# Patient Record
Sex: Female | Born: 1975 | Race: Asian | Hispanic: No | Marital: Married | Smoking: Never smoker
Health system: Southern US, Community
[De-identification: ages and names within clinical notes are randomized; demographics above are authoritative.]

## PROBLEM LIST (undated history)

## (undated) DIAGNOSIS — IMO0002 Reserved for concepts with insufficient information to code with codable children: Secondary | ICD-10-CM

## (undated) DIAGNOSIS — K8020 Calculus of gallbladder without cholecystitis without obstruction: Secondary | ICD-10-CM

## (undated) DIAGNOSIS — K219 Gastro-esophageal reflux disease without esophagitis: Secondary | ICD-10-CM

## (undated) HISTORY — DX: Reserved for concepts with insufficient information to code with codable children: IMO0002

## (undated) HISTORY — DX: Gastro-esophageal reflux disease without esophagitis: K21.9

---

## 2012-09-17 ENCOUNTER — Ambulatory Visit (INDEPENDENT_AMBULATORY_CARE_PROVIDER_SITE_OTHER): Payer: BC Managed Care – PPO | Admitting: Family Medicine

## 2012-09-17 VITALS — BP 108/70 | HR 72 | Temp 98.0°F | Resp 16 | Ht 60.0 in | Wt 138.6 lb

## 2012-09-17 DIAGNOSIS — Z23 Encounter for immunization: Secondary | ICD-10-CM

## 2012-09-17 DIAGNOSIS — Z2839 Other underimmunization status: Secondary | ICD-10-CM

## 2012-09-17 DIAGNOSIS — Z283 Underimmunization status: Secondary | ICD-10-CM

## 2012-09-17 DIAGNOSIS — Z0000 Encounter for general adult medical examination without abnormal findings: Secondary | ICD-10-CM

## 2012-09-17 LAB — POCT UA - MICROSCOPIC ONLY
Casts, Ur, LPF, POC: NEGATIVE
Crystals, Ur, HPF, POC: NEGATIVE
Mucus, UA: NEGATIVE
RBC, urine, microscopic: NEGATIVE
WBC, Ur, HPF, POC: NEGATIVE
Yeast, UA: NEGATIVE

## 2012-09-17 LAB — POCT WET PREP WITH KOH
Clue Cells Wet Prep HPF POC: NEGATIVE
KOH Prep POC: NEGATIVE
RBC Wet Prep HPF POC: NEGATIVE
Trichomonas, UA: NEGATIVE
Yeast Wet Prep HPF POC: NEGATIVE

## 2012-09-17 LAB — POCT URINALYSIS DIPSTICK
Bilirubin, UA: NEGATIVE
Blood, UA: NEGATIVE
Glucose, UA: NEGATIVE
Ketones, UA: NEGATIVE
Leukocytes, UA: NEGATIVE
Nitrite, UA: NEGATIVE
Protein, UA: NEGATIVE
Spec Grav, UA: 1.015
Urobilinogen, UA: 0.2
pH, UA: 7.5

## 2012-09-17 NOTE — Progress Notes (Signed)
[facility]:  Office Visit  Chief Complaint:  Chief Complaint  Patient presents with  . CPE w/PAP    Pt is not fasting - pt states she has had cake and coffeew/cream and sugar  . Immunizations    May need Hep B #3; Tdap  . URI    HA; neck pain x 1 dy    HPI: Pamela Stanley is a 36 y.o. female who complains of  CPE. Has never had a pap. She recently brings in her immunization records from [place] which she got prior to coming to the [place] about 3 months ago. The vaccine records are sparse. Upon review all I can see that she is missing is the 2nd dose of the Varicella and also her 3rd dose of Hep B. Patient also c/o URI sxs.   Past Medical History  Diagnosis Date  . Ulcer    History reviewed. No pertinent past surgical history. History   Social History  . Marital Status: Married    Spouse Name: N/A    Number of Children: N/A  . Years of Education: N/A   Social History Main Topics  . Smoking status: Never Smoker   . Smokeless tobacco: None  . Alcohol Use: No  . Drug Use: No  . Sexually Active: Yes   Other Topics Concern  . None   Social History Narrative  . None   Family History  Problem Relation Age of Onset  . Hypertension Father    No Known Allergies Prior to Admission medications   Not on File     ROS: The patient denies fevers, chills, night sweats, unintentional weight loss, chest pain, palpitations, wheezing, dyspnea on exertion, nausea, vomiting, abdominal pain, dysuria, hematuria, melena, numbness, weakness, or tingling.  All other systems have been reviewed and were otherwise negative with the exception of those mentioned in the HPI and as above.    PHYSICAL EXAM: Filed Vitals:   09/17/12 1122  BP: 108/70  Pulse: 72  Temp: 98 F (36.7 C)  Resp: 16   Filed Vitals:   09/17/12 1122  Height: 5' (1.524 m)  Weight: 138 lb 9.6 oz (62.869 kg)   Body mass index is 27.07 kg/(m^2).  General: Alert, no acute distress HEENT:   Normocephalic, atraumatic, oropharynx patent. EOMI, PERRLA, fundoscopic exam nl.  Cardiovascular:  Regular rate and rhythm, no rubs murmurs or gallops.  No Carotid bruits, radial pulse intact. No pedal edema.  Respiratory: Clear to auscultation bilaterally.  No wheezes, rales, or rhonchi.  No cyanosis, no use of accessory musculature GI: No organomegaly, abdomen is soft and non-tender, positive bowel sounds.  No masses. Skin: No rashes. Neurologic: Facial musculature symmetric. Psychiatric: Patient is appropriate throughout our interaction. Lymphatic: No cervical lymphadenopathy Musculoskeletal: Gait intact. Neck ROM nl. No meningeal signs Breast exam nl GU-no CMT, cervix is normal appearing except for nabothia cyst at 2 pm. She has white dc but no purulent dc.    LABS: Results for orders placed in visit on 09/17/12  POCT UA - MICROSCOPIC ONLY      Component Value Range   WBC, Ur, HPF, POC neg     RBC, urine, microscopic neg     Bacteria, U Microscopic trace     Mucus, UA neg     Epithelial cells, urine per micros 1-2     Crystals, Ur, HPF, POC neg     Casts, Ur, LPF, POC neg     Yeast, UA neg  POCT URINALYSIS DIPSTICK      Component Value Range   Color, UA yellow     Clarity, UA slightly cloudy     Glucose, UA neg     Bilirubin, UA neg     Ketones, UA neg     Spec Grav, UA 1.015     Blood, UA neg     pH, UA 7.5     Protein, UA neg     Urobilinogen, UA 0.2     Nitrite, UA neg     Leukocytes, UA Negative    POCT WET PREP WITH KOH      Component Value Range   Trichomonas, UA Negative     Clue Cells Wet Prep HPF POC neg     Epithelial Wet Prep HPF POC 5-10     Yeast Wet Prep HPF POC neg     Bacteria Wet Prep HPF POC 2+     RBC Wet Prep HPF POC neg     WBC Wet Prep HPF POC 1-3     KOH Prep POC Negative       EKG/XRAY:   Primary read interpreted by Dr. Conley Rolls at [facility].   ASSESSMENT/PLAN: Encounter Diagnoses  Name Primary?  . Annual physical exam Yes  . Immunization  deficiency    WIll return in AM for labs since not fasting [facility] receive Hep B 3rd dose and also Flu vaccine     Vivien Barretto PHUONG, DO 09/17/2012 1:07 PM

## 2012-09-18 ENCOUNTER — Other Ambulatory Visit (INDEPENDENT_AMBULATORY_CARE_PROVIDER_SITE_OTHER): Payer: BC Managed Care – PPO

## 2012-09-18 DIAGNOSIS — Z0000 Encounter for general adult medical examination without abnormal findings: Secondary | ICD-10-CM

## 2012-09-18 DIAGNOSIS — Z2839 Other underimmunization status: Secondary | ICD-10-CM

## 2012-09-18 DIAGNOSIS — Z283 Underimmunization status: Secondary | ICD-10-CM

## 2012-09-18 LAB — COMPREHENSIVE METABOLIC PANEL
ALT: 21 U/L (ref 0–35)
AST: 21 U/L (ref 0–37)
Albumin: 3.9 g/dL (ref 3.5–5.2)
Alkaline Phosphatase: 39 U/L (ref 39–117)
BUN: 12 mg/dL (ref 6–23)
Creat: 0.65 mg/dL (ref 0.50–1.10)
Potassium: 4.6 mEq/L (ref 3.5–5.3)

## 2012-09-18 LAB — COMPREHENSIVE METABOLIC PANEL WITH GFR
CO2: 27 meq/L (ref 19–32)
Calcium: 9.3 mg/dL (ref 8.4–10.5)
Chloride: 107 meq/L (ref 96–112)
Glucose, Bld: 90 mg/dL (ref 70–99)
Sodium: 140 meq/L (ref 135–145)
Total Bilirubin: 0.4 mg/dL (ref 0.3–1.2)
Total Protein: 6.5 g/dL (ref 6.0–8.3)

## 2012-09-18 NOTE — Progress Notes (Signed)
Patient here for labs only. 

## 2012-09-21 LAB — PAP IG, CT-NG, RFX HPV ASCU
Chlamydia Probe Amp: NEGATIVE
GC Probe Amp: NEGATIVE

## 2012-10-06 ENCOUNTER — Encounter: Payer: Self-pay | Admitting: Family Medicine

## 2012-10-11 ENCOUNTER — Encounter: Payer: Self-pay | Admitting: Family Medicine

## 2012-12-27 ENCOUNTER — Ambulatory Visit (INDEPENDENT_AMBULATORY_CARE_PROVIDER_SITE_OTHER): Payer: BC Managed Care – PPO | Admitting: Emergency Medicine

## 2012-12-27 VITALS — BP 110/74 | HR 73 | Temp 98.1°F | Resp 16 | Ht 60.3 in | Wt 137.0 lb

## 2012-12-27 DIAGNOSIS — G5680 Other specified mononeuropathies of unspecified upper limb: Secondary | ICD-10-CM

## 2012-12-27 DIAGNOSIS — S335XXA Sprain of ligaments of lumbar spine, initial encounter: Secondary | ICD-10-CM

## 2012-12-27 MED ORDER — TRAMADOL-ACETAMINOPHEN 37.5-325 MG PO TABS: 1.0000 | ORAL_TABLET | Freq: Four times a day (QID) | ORAL | Status: DC | PRN

## 2012-12-27 MED ORDER — TRIAMCINOLONE ACETONIDE 0.1 % EX CREA: TOPICAL_CREAM | Freq: Two times a day (BID) | CUTANEOUS | Status: DC

## 2012-12-27 MED ORDER — CYCLOBENZAPRINE HCL 10 MG PO TABS: 10.0000 mg | ORAL_TABLET | Freq: Three times a day (TID) | ORAL | Status: DC | PRN

## 2012-12-27 MED ORDER — NAPROXEN SODIUM 550 MG PO TABS: 550.0000 mg | ORAL_TABLET | Freq: Two times a day (BID) | ORAL | Status: DC

## 2012-12-27 NOTE — Patient Instructions (Addendum)
Pamela Stanley  (Lumbrosacral Strain)  Pamela Stanley là m?t trong nh?ng nguyên nhân ph? bi?n nh?t c?a ?au Stanley. Có nhi?u nguyên nhân có th? gây ra ?au Stanley. H?u h?t không ph?i là tình tr?ng nghiêm tr?ng.   NGUYÊN NHÂN  X??ng s?ng (c?t s?ng) ???c t?o t? 24 ??t s?ng chính ngoài x??ng cùng và x??ng c?t. Các x??ng này ???c gi? v?i nhau b?ng mô x? Pamela ???c g?i là dây ch?ng và c? b?ng c?. Các r? th?n kinh xuyên qua các l? gi?a các ??t s?ng. S? di chuy?n ??t ng?t ho?c ch?n th??ng Stanley có th? gây t?n th??ng ho?c gây áp l?c lên nh?ng dây th?n kinh này. ?i?u này có th? d?n ??n ?au Stanley c?c b? ho?c d?ch chuy?n (b?c x?) ?au xu?ng mông và xu?ng chân. Tình tr?ng ???c bi?t ??n nh? là ?au th?n kinh t?a (?au nhói t? mông xu?ng ph?n sau chân) th??ng k?t h?p v?i thoát v? ??a ??m. ?au có th? ch? gây ra b?i s? co th?t c?.  Chuyên gia ch?m sóc y t? có th? th??ng tìm th?y nguyên nhân ?au t? chi ti?t c?a tri?u ch?ng và xét nghi?m. Trong m?t s? tr??ng h?p, b?n có th? c?n m?t s? ki?m tra (ch?ng h?n nh? ch?p x-quang), nh?ng nh?ng ki?m tra này có th? không phù h?p v?i b?n. Chuyên gia ch?m sóc y t? s? làm vi?c v?i b?n ?? quy?t ??nh xem b?n có c?n b?t c? ki?m tra nào không d?a vào xét nghi?m c? th? c?a b?n.  H??NG D?N CH?M SÓC T?I NHÀ  · Tránh phong cách s?ng ít ho?t ??ng. T?p luy?n ch? ??ng, theo h??ng d?n c?a chuyên gia ch?m sóc y t?, là v? khí t?t nh?t ch?ng l?i ?au Stanley.  · N?u b?n ?ang không ? trong tình tr?ng th? ch?t thích h?p cho nó, tránh nh?ng ho?t ??ng th? ch?t n?ng (qu?n v?t, l??t ván n??c). ?i?u này có th? làm tr?m tr?ng thêm và/ho?c t?o ra v?n ??.  · N?u b?n có m?t v?n ?? v? Stanley, tránh nh?ng môn th? thao ?òi h?i ph?i chuy?n ??ng c? th? ??t ng?t. B?i và ?i b? th??ng là nh?ng ho?t ??ng an toàn h?n.  · Duy trì t? th? t?t.  · Tránh b? quá cân (béo phì).  · Ch? n?m ngh? trên gi??ng trong giai ?o?n cùng c?c, ??t ng?t nh?t (c?p tính). Chuyên gia ch?m sóc y t? s? giúp b?n xác ??nh xem b?n c?n n?m ngh? trên gi??ng trong bao lâu.  · ??i  v?i nh?ng ?i?u ki?n c?p tính, b?n có th? ch??m ?á lên vùng b? th??ng.  · Cho ?á bào vào túi nh?a.  · ??t kh?n t?m gi?a da và túi.  · Ch??m ?á trong 15 ??n 20 phút m?i l?n, 2 gi? m?t l?n, ho?c khi c?n thi?t.  · Sau khi b?n khá h?n và n?ng ??ng h?n, vi?c ch??m nhi?t trong 30 phút tr??c khi ho?t ??ng có th? giúp ích.  G?p chuyên gia ch?m sóc y t? n?u b?n b? ?au kéo dài h?n mong ??i. Chuyên gia ch?m sóc y t? có th? giúp ho?c t? v?n cho b?n v? nh?ng bài t?p và/ho?c li?u pháp thích h?p, n?u c?n. V?i ?i?u ki?n, h?u h?t v?n ?? v? Stanley có th? tránh ???c.   HÃY NGAY L?P T?C THAM V?N V?I CHUYÊN GIA Y T? N?U:  · B?n b? tê Pamela, ?au nhói dây th?n kinh, y?u ho?c v?n ?? v?i vi?c s?   d?ng tay ho?c chân.  · B?n b? ?au Stanley n?ng không gi?m ???c nh? thu?c.  · Có s? thay ??i trong s? ki?m soát ru?t ho?c bàng quang.  · B?n b? ?au gia t?ng trong b?t k? khu v?c nào c?a c? th?, bao g?m d? dày ho?c b?ng.  · B?n th?y khó th?, hoa m?t ho?c ng?t.  · B?n c?m th?y khó ch?u trong d? dày (bu?n nôn), nôn ho?c vã m? hôi.  · B?n th?y bi?n màu ngón chân ho?c chân ho?c bàn chân tr? nên r?t l?nh.  · ?au Stanley tr? nên t? h?n.  · B?n b? s?t.  ??M B?O B?N:   · Hi?u các h??ng d?n này.  · Theo dõi tình tr?ng c?a mình.  · Yêu c?u tr? giúp ngay l?p t?c n?u b?n c?m th?y không kh?e ho?c tình tr?ng tr? nên t?i t? h?n.  Document Released: 08/28/2005 Document Revised: 02/10/2012  Exit[employer]® Patient Information ©2013 [employer].

## 2012-12-27 NOTE — Progress Notes (Signed)
[facility] [address] [place] [address] [phone]  Date:  12/27/2012   Name:  Pamela Stanley   DOB:  24-May-1976   MRN:  [id number]  PCP:  No primary provider on file.    Chief Complaint: Back Pain   History of Present Illness:  Pamela Stanley is a 37 y.o. very pleasant female patient who presents with the following:  No history of injury or overuse.  Yesterday developed pain in low back on right side and pain in posterior right shoulder.  No radiation or neuro symptoms.  No history of prior back pain.  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  No other complaints  There is no problem list on file for this patient.   Past Medical History  Diagnosis Date  . Ulcer     No past surgical history on file.  History  Substance Use Topics  . Smoking status: Never Smoker   . Smokeless tobacco: Not on file  . Alcohol Use: No    Family History  Problem Relation Age of Onset  . Hypertension Father     No Known Allergies  Medication list has been reviewed and updated.  No current outpatient prescriptions on file prior to visit.    Review of Systems:  As per HPI, otherwise negative.    Physical Examination: Filed Vitals:   12/27/12 1343  BP: 110/74  Pulse: 73  Temp: 98.1 F (36.7 C)  Resp: 16   Filed Vitals:   12/27/12 1343  Height: 5' 0.3" (1.532 m)  Weight: 137 lb (62.143 kg)   Body mass index is 26.49 kg/(m^2). Ideal Body Weight: Weight in (lb) to have BMI = 25: 129    GEN: WDWN, NAD, Non-toxic, Alert & Oriented x 3 HEENT: Atraumatic, Normocephalic.  Ears and Nose: No external deformity. EXTR: No clubbing/cyanosis/edema NEURO: Normal gait.  PSYCH: Normally interactive. Conversant. Not depressed or anxious appearing.  Calm demeanor.  BACK:  Tender right posterior shoulder medial to scapula and in lower para spinous muscle lumbar area.  Normal motor and cerebellar exam  Assessment and  Plan: Lumbar strain Anaprox Flexeril Ultram Follow up as needed Local heat  Carmelina Dane, MD

## 2013-01-03 ENCOUNTER — Ambulatory Visit (INDEPENDENT_AMBULATORY_CARE_PROVIDER_SITE_OTHER): Payer: BC Managed Care – PPO | Admitting: Family Medicine

## 2013-01-03 ENCOUNTER — Ambulatory Visit: Payer: BC Managed Care – PPO

## 2013-01-03 VITALS — BP 110/80 | HR 101 | Temp 98.0°F | Resp 16 | Ht 61.0 in | Wt 137.0 lb

## 2013-01-03 DIAGNOSIS — M542 Cervicalgia: Secondary | ICD-10-CM

## 2013-01-03 DIAGNOSIS — R200 Anesthesia of skin: Secondary | ICD-10-CM

## 2013-01-03 DIAGNOSIS — M79669 Pain in unspecified lower leg: Secondary | ICD-10-CM

## 2013-01-03 DIAGNOSIS — M25539 Pain in unspecified wrist: Secondary | ICD-10-CM

## 2013-01-03 DIAGNOSIS — M79609 Pain in unspecified limb: Secondary | ICD-10-CM

## 2013-01-03 DIAGNOSIS — N938 Other specified abnormal uterine and vaginal bleeding: Secondary | ICD-10-CM

## 2013-01-03 DIAGNOSIS — N926 Irregular menstruation, unspecified: Secondary | ICD-10-CM

## 2013-01-03 DIAGNOSIS — N949 Unspecified condition associated with female genital organs and menstrual cycle: Secondary | ICD-10-CM

## 2013-01-03 DIAGNOSIS — M25531 Pain in right wrist: Secondary | ICD-10-CM

## 2013-01-03 DIAGNOSIS — Z789 Other specified health status: Secondary | ICD-10-CM | POA: Insufficient documentation

## 2013-01-03 DIAGNOSIS — Z603 Acculturation difficulty: Secondary | ICD-10-CM

## 2013-01-03 DIAGNOSIS — R209 Unspecified disturbances of skin sensation: Secondary | ICD-10-CM

## 2013-01-03 MED ORDER — PREDNISONE 20 MG PO TABS: ORAL_TABLET | ORAL | Status: DC

## 2013-01-03 NOTE — Patient Instructions (Addendum)
We are going to use the prednisone for a pinched nerve in your back.  Do not use the anaprox with this medication-  However you may use the flexeril or ultracet  Use the wrist splint for your right wrist  If you develop any worsening numbness, slurred speech or facial drooping you could be having a stroke and should seek care right away  Take a home pregnancy test if you do not get your period in the next 2 days.  If positive please call and let us know

## 2013-01-03 NOTE — Progress Notes (Signed)
[facility] [address] [place] [address] [phone]  Date:  01/03/2013   Name:  Pamela Stanley   DOB:  12/11/1975   MRN:  [id number]  PCP:  No primary provider on file.    Chief Complaint: Numbness and Back Pain   History of Present Illness:  Pamela Stanley is a 37 y.o. very pleasant female patient who presents with the following:  Here about one week ago with complaint of right LBP and pain in right shoulder.  She was treated with anaprox, flexeril, ultracet for a lumbar strain.  She took these medications just for 3 days and seemed to feel better.   She now notes numbness on the right side of her body- the right arm for about a week, and the right leg since yesterday.  Last week she noted numbness of her right arm- this is better, no longer numb but does have some intermittent tingling  However, yesterday she noted onset of numbness in her right leg- it seems to radiate down her right leg from the buttock to the ankle.   She states she has never had this in the past.  Her leg symptoms are worse with sitting.   She does not noted pain in her leg, but has tightness and stiffness in her right lower back and right buttock.   No incontinence problems LMP- she is 2 days late.  She states it is not unusual for her to have sporadic cycles.    There is no problem list on file for this patient.   Past Medical History  Diagnosis Date  . Ulcer     No past surgical history on file.  History  Substance Use Topics  . Smoking status: Never Smoker   . Smokeless tobacco: Not on file  . Alcohol Use: No    Family History  Problem Relation Age of Onset  . Hypertension Father     No Known Allergies  Medication list has been reviewed and updated.  Current Outpatient Prescriptions on File Prior to Visit  Medication Sig Dispense Refill  . cyclobenzaprine (FLEXERIL) 10 MG tablet Take 1 tablet (10 mg total) by mouth 3 (three) times daily as needed for muscle  spasms.  30 tablet  0  . naproxen sodium (ANAPROX DS) 550 MG tablet Take 1 tablet (550 mg total) by mouth 2 (two) times daily with a meal.  40 tablet  0  . traMADol-acetaminophen (ULTRACET) 37.5-325 MG per tablet Take 1 tablet by mouth every 6 (six) hours as needed for pain.  30 tablet  0  . triamcinolone cream (KENALOG) 0.1 % Apply topically 2 (two) times daily.  30 g  0    Review of Systems:  As per HPI- otherwise negative.   Physical Examination: Filed Vitals:   01/03/13 0841  BP: 120/90  Pulse: 101  Temp: 98 F (36.7 C)  Resp: 16   Filed Vitals:   01/03/13 0841  Height: 5\' 1"  (1.549 m)  Weight: 137 lb (62.143 kg)   Body mass index is 25.89 kg/(m^2). Ideal Body Weight: Weight in (lb) to have BMI = 25: 132   GEN: WDWN, NAD, Non-toxic, A & O x 3 HEENT: Atraumatic, Normocephalic. Neck supple. No masses, No LAD.  Bilateral TM wnl, oropharynx normal.  PEERL,EOMI.   Ears and Nose: No external deformity. CV: RRR, No M/G/R. No JVD. No thrill. No extra heart sounds. PULM: CTA B, no wheezes, crackles, rhonchi. No retractions. No resp. distress. No  accessory muscle use. ABD: S, NT, ND, +BS. No rebound. No HSM. EXTR: No c/c/e NEURO Normal gait. Negative romberg, no facial drooping or weakness.  She has ful strength and normal DTR all extremities.   Back: notes a feeling of "pulling" from her right lower back down the riht leg, and tightness with right SLR.  It is difficult to determine if she has any sensation changes of her extremities.  She may have some decreased sensation along the distribution of her right median nerve, but this is questionable.   PSYCH: Normally interactive. Conversant. Not depressed or anxious appearing.  Calm demeanor.   Results for orders placed in visit on 01/03/13  POCT URINE PREGNANCY      Component Value Range   Preg Test, Ur Negative     [facility] reading (PRIMARY) by  Dr. Patsy Lager. Cervical spine: normal Lumbar spine: normal  CERVICAL SPINE - 2-3  VIEW  Comparison: None.  Findings: Cervical spine is visualized to C7-T1 on the lateral view.  Normal cervical lordosis.  No evidence of fracture or dislocation. Vertebral body heights and intervertebral disc spaces are maintained.  No prevertebral soft tissue swelling.  Visualized lung apices are clear.  An odontoid view was not performed.  IMPRESSION: No fracture or dislocation is seen.   LUMBAR SPINE - COMPLETE 4+ VIEW  Comparison: None.  Findings: Five lumbar-type vertebral bodies.  Mild straightening of the lumbar spine.  No evidence of fracture or dislocation. Vertebral body heights are maintained.  Mild multilevel degenerative changes.  Visualized bony pelvis appears intact.  IMPRESSION: No fracture or dislocation is seen.  Mild degenerative changes.  Clinically significant discrepancy from primary report, if provided: None   Assessment and Plan: 1. Late menses  POCT urine pregnancy  2. Pain, lower leg    3. Numbness of leg  DG Lumbar Spine Complete, predniSONE (DELTASONE) 20 MG tablet  4. Pain, neck  DG Cervical Spine 2 or 3 views   Discussed CVA in detail and offered to do an MRI today.  However, Madason declined this test.  She would like to try medicaitons as above to see if they will resolve her symtoms.  Will treat with prednisone for a probable nerve impingement on the right, and a wrist splint for possible CTS.  She will let us know if not better in the next 2 days, Sooner if worse.     Abbe Amsterdam, MD

## 2013-03-26 ENCOUNTER — Ambulatory Visit (INDEPENDENT_AMBULATORY_CARE_PROVIDER_SITE_OTHER): Payer: BC Managed Care – PPO | Admitting: Emergency Medicine

## 2013-03-26 ENCOUNTER — Ambulatory Visit: Payer: BC Managed Care – PPO

## 2013-03-26 VITALS — BP 114/80 | HR 74 | Temp 98.2°F | Resp 16 | Ht 60.0 in | Wt 140.0 lb

## 2013-03-26 DIAGNOSIS — Z609 Problem related to social environment, unspecified: Secondary | ICD-10-CM

## 2013-03-26 DIAGNOSIS — R0602 Shortness of breath: Secondary | ICD-10-CM

## 2013-03-26 DIAGNOSIS — Z0000 Encounter for general adult medical examination without abnormal findings: Secondary | ICD-10-CM

## 2013-03-26 DIAGNOSIS — Z603 Acculturation difficulty: Secondary | ICD-10-CM

## 2013-03-26 LAB — POCT CBC
Hemoglobin: 14.6 g/dL (ref 12.2–16.2)
Lymph, poc: 2.8 (ref 0.6–3.4)
MCH, POC: 29.3 pg (ref 27–31.2)
MCV: 92.6 fL (ref 80–97)
MID (cbc): 0.7 (ref 0–0.9)
Platelet Count, POC: 366 10*3/uL (ref 142–424)
RBC: 4.98 M/uL (ref 4.04–5.48)
WBC: 8.1 10*3/uL (ref 4.6–10.2)

## 2013-03-26 LAB — POCT URINALYSIS DIPSTICK
Bilirubin, UA: NEGATIVE
Ketones, UA: NEGATIVE
pH, UA: 6

## 2013-03-26 NOTE — Progress Notes (Signed)
[facility] [address], [place] [address] [phone]  Date:  03/26/2013   Name:  Pamela Stanley   DOB:  1976-03-31   MRN:  [id number]  PCP:  No primary provider on file.    Chief Complaint: Annual Exam   History of Present Illness:  Pamela Stanley is a 37 y.o. very pleasant female patient who presents with the following:  Patient has a couple of concerns.  Wants labs and a routine exam.  Concerned as she has occasional episodes of sensation of shortness of breath.  No cough or coryza, chest pain, nausea or vomiting.  No sputum production.Marland Kitchen  uptodate on immunizations.  Works as a Radio broadcast assistant.  Denies other complaint or health concern today.   Patient Active Problem List  Diagnosis  . Language barrier    Past Medical History  Diagnosis Date  . Ulcer   . GERD (gastroesophageal reflux disease)     No past surgical history on file.  History  Substance Use Topics  . Smoking status: Never Smoker   . Smokeless tobacco: Not on file  . Alcohol Use: No    Family History  Problem Relation Age of Onset  . Hypertension Father     No Known Allergies  Medication list has been reviewed and updated.  Current Outpatient Prescriptions on File Prior to Visit  Medication Sig Dispense Refill  . cyclobenzaprine (FLEXERIL) 10 MG tablet Take 1 tablet (10 mg total) by mouth 3 (three) times daily as needed for muscle spasms.  30 tablet  0  . naproxen sodium (ANAPROX DS) 550 MG tablet Take 1 tablet (550 mg total) by mouth 2 (two) times daily with a meal.  40 tablet  0  . predniSONE (DELTASONE) 20 MG tablet Take 2 pills for 3 days, then 1 pill for 3 days  9 tablet  0  . traMADol-acetaminophen (ULTRACET) 37.5-325 MG per tablet Take 1 tablet by mouth every 6 (six) hours as needed for pain.  30 tablet  0  . triamcinolone cream (KENALOG) 0.1 % Apply topically 2 (two) times daily.  30 g  0   No current facility-administered medications on file prior to visit.    Review  of Systems:  As per HPI, otherwise negative.    Physical Examination: Filed Vitals:   03/26/13 1037  BP: 114/80  Pulse: 74  Temp: 98.2 F (36.8 C)  Resp: 16   Filed Vitals:   03/26/13 1037  Height: 5' (1.524 m)  Weight: 140 lb (63.504 kg)   Body mass index is 27.34 kg/(m^2). Ideal Body Weight: Weight in (lb) to have BMI = 25: 127.7  GEN: WDWN, NAD, Non-toxic, A & O x 3 HEENT: Atraumatic, Normocephalic. Neck supple. No masses, No LAD. Ears and Nose: No external deformity. CV: RRR, No M/G/R. No JVD. No thrill. No extra heart sounds. PULM: CTA B, no wheezes, crackles, rhonchi. No retractions. No resp. distress. No accessory muscle use. ABD: S, NT, ND, +BS. No rebound. No HSM. EXTR: No c/c/e NEURO Normal gait.  PSYCH: Normally interactive. Conversant. Not depressed or anxious appearing.  Calm demeanor.    Assessment and Plan: Wellness exam Labs pending   Signed,  Phillips Odor, MD   Results for orders placed in visit on 03/26/13  POCT CBC      Result Value Range   WBC 8.1  4.6 - 10.2 K/uL   Lymph, poc 2.8  0.6 - 3.4   POC LYMPH PERCENT 34.6  10 -  50 %L   MID (cbc) 0.7  0 - 0.9   POC MID % 9.0  0 - 12 %M   POC Granulocyte 4.6  2 - 6.9   Granulocyte percent 56.4  37 - 80 %G   RBC 4.98  4.04 - 5.48 M/uL   Hemoglobin 14.6  12.2 - 16.2 g/dL   HCT, POC 78.2  95.6 - 47.9 %   MCV 92.6  80 - 97 fL   MCH, POC 29.3  27 - 31.2 pg   MCHC 31.7 (*) 31.8 - 35.4 g/dL   RDW, POC 21.3     Platelet Count, POC 366  142 - 424 K/uL   MPV 7.3  0 - 99.8 fL  POCT URINALYSIS DIPSTICK      Result Value Range   Color, UA yellow     Clarity, UA clear     Glucose, UA neg     Bilirubin, UA neg     Ketones, UA neg     Spec Grav, UA 1.015     Blood, UA large     pH, UA 6.0     Protein, UA neg     Urobilinogen, UA 0.2     Nitrite, UA neg     Leukocytes, UA Trace      [facility] reading (PRIMARY) by  Dr. Dareen Piano. Negative chest.

## 2013-03-27 LAB — LIPID PANEL
Cholesterol: 225 mg/dL — ABNORMAL HIGH (ref 0–200)
HDL: 52 mg/dL (ref >39)
Total CHOL/HDL Ratio: 4.3 Ratio

## 2013-03-27 LAB — COMPREHENSIVE METABOLIC PANEL
BUN: 12 mg/dL (ref 6–23)
CO2: 31 mEq/L (ref 19–32)
Calcium: 9.9 mg/dL (ref 8.4–10.5)
Chloride: 102 mEq/L (ref 96–112)
Creat: 0.64 mg/dL (ref 0.50–1.10)

## 2013-06-15 ENCOUNTER — Ambulatory Visit (INDEPENDENT_AMBULATORY_CARE_PROVIDER_SITE_OTHER): Payer: BC Managed Care – PPO | Admitting: Family Medicine

## 2013-06-15 VITALS — BP 102/70 | HR 93 | Temp 97.9°F | Resp 18 | Ht 60.5 in | Wt 138.0 lb

## 2013-06-15 DIAGNOSIS — L678 Other hair color and hair shaft abnormalities: Secondary | ICD-10-CM

## 2013-06-15 DIAGNOSIS — L739 Follicular disorder, unspecified: Secondary | ICD-10-CM

## 2013-06-15 DIAGNOSIS — L738 Other specified follicular disorders: Secondary | ICD-10-CM

## 2013-06-15 DIAGNOSIS — H5789 Other specified disorders of eye and adnexa: Secondary | ICD-10-CM

## 2013-06-15 DIAGNOSIS — H579 Unspecified disorder of eye and adnexa: Secondary | ICD-10-CM

## 2013-06-15 MED ORDER — OFLOXACIN 0.3 % OP SOLN: 2.0000 [drp] | Freq: Four times a day (QID) | OPHTHALMIC | Status: DC

## 2013-06-15 NOTE — Progress Notes (Signed)
[facility]:  Office Visit  Chief Complaint:  Chief Complaint  Patient presents with  . poked pin in rt eye while doing eyelashes    HPI: Pamela Stanley is a 37 y.o. female who complains of using an eyeliner and it went into her right eye. She did this Sunday. She deneis vision loss or blurriness but there is a discomfort in her right eye.  No discharge. No eye pain, just discomfort. No light sensitivty. Denies glaucoma or cataracts  Past Medical History  Diagnosis Date  . Ulcer   . GERD (gastroesophageal reflux disease)    History reviewed. No pertinent past surgical history. History   Social History  . Marital Status: Married    Spouse Name: N/A    Number of Children: N/A  . Years of Education: N/A   Social History Main Topics  . Smoking status: Never Smoker   . Smokeless tobacco: None  . Alcohol Use: No  . Drug Use: No  . Sexually Active: Yes    Birth Control/ Protection: None   Other Topics Concern  . None   Social History Narrative  . None   Family History  Problem Relation Age of Onset  . Hypertension Father    No Known Allergies Prior to Admission medications   Medication Sig Start Date End Date Taking? Authorizing Provider  triamcinolone cream (KENALOG) 0.1 % Apply topically 2 (two) times daily. 12/27/12   Phillips Odor, MD     ROS: The patient denies fevers, chills, night sweats, unintentional weight loss, chest pain, palpitations, wheezing, dyspnea on exertion, nausea, vomiting, abdominal pain, dysuria, hematuria, melena, numbness, weakness, or tingling.  All other systems have been reviewed and were otherwise negative with the exception of those mentioned in the HPI and as above.    PHYSICAL EXAM: Filed Vitals:   06/15/13 1830  BP: 102/70  Pulse: 93  Temp: 97.9 F (36.6 C)  Resp: 18   Filed Vitals:   06/15/13 1830  Height: 5' 0.5" (1.537 m)  Weight: 138 lb (62.596 kg)   Body mass index is 26.5 kg/(m^2).  General:  Alert, no acute distress HEENT:  Normocephalic, atraumatic, oropharynx patent. EOMI< PERRLA, fundoscopic exam normal. + follicular irritation on upper eyelash on upper eyelid near nasal bridge. No fluorscien uptake Cardiovascular:  Regular rate and rhythm, no rubs murmurs or gallops.  No Carotid bruits, radial pulse intact. No pedal edema.  Respiratory: Clear to auscultation bilaterally.  No wheezes, rales, or rhonchi.  No cyanosis, no use of accessory musculature GI: No organomegaly, abdomen is soft and non-tender, positive bowel sounds.  No masses. Skin: No rashes. Neurologic: Facial musculature symmetric. Psychiatric: Patient is appropriate throughout our interaction. Lymphatic: No cervical lymphadenopathy Musculoskeletal: Gait intact.   LABS: Results for orders placed in visit on 03/26/13  COMPREHENSIVE METABOLIC PANEL      Result Value Range   Sodium 138  135 - 145 mEq/L   Potassium 4.8  3.5 - 5.3 mEq/L   Chloride 102  96 - 112 mEq/L   CO2 31  19 - 32 mEq/L   Glucose, Bld 91  70 - 99 mg/dL   BUN 12  6 - 23 mg/dL   Creat 8.29  5.62 - 1.30 mg/dL   Total Bilirubin 0.5  0.3 - 1.2 mg/dL   Alkaline Phosphatase 51  39 - 117 U/L   AST 20  0 - 37 U/L   ALT 15  0 - 35 U/L   Total Protein 7.7  6.0 - 8.3 g/dL   Albumin 4.5  3.5 - 5.2 g/dL   Calcium 9.9  8.4 - 16.1 mg/dL  LIPID PANEL      Result Value Range   Cholesterol 225 (*) 0 - 200 mg/dL   Triglycerides 096  <045 mg/dL   HDL 52  >40 mg/dL   Total CHOL/HDL Ratio 4.3     VLDL 25  0 - 40 mg/dL   LDL Cholesterol 981 (*) 0 - 99 mg/dL  TSH      Result Value Range   TSH 0.972  0.350 - 4.500 uIU/mL  POCT CBC      Result Value Range   WBC 8.1  4.6 - 10.2 K/uL   Lymph, poc 2.8  0.6 - 3.4   POC LYMPH PERCENT 34.6  10 - 50 %L   MID (cbc) 0.7  0 - 0.9   POC MID % 9.0  0 - 12 %M   POC Granulocyte 4.6  2 - 6.9   Granulocyte percent 56.4  37 - 80 %G   RBC 4.98  4.04 - 5.48 M/uL   Hemoglobin 14.6  12.2 - 16.2 g/dL   HCT, POC 19.1   47.8 - 47.9 %   MCV 92.6  80 - 97 fL   MCH, POC 29.3  27 - 31.2 pg   MCHC 31.7 (*) 31.8 - 35.4 g/dL   RDW, POC 29.5     Platelet Count, POC 366  142 - 424 K/uL   MPV 7.3  0 - 99.8 fL  POCT URINALYSIS DIPSTICK      Result Value Range   Color, UA yellow     Clarity, UA clear     Glucose, UA neg     Bilirubin, UA neg     Ketones, UA neg     Spec Grav, UA 1.015     Blood, UA large     pH, UA 6.0     Protein, UA neg     Urobilinogen, UA 0.2     Nitrite, UA neg     Leukocytes, UA Trace       EKG/XRAY:   Primary read interpreted by Dr. Conley Rolls at [facility].   ASSESSMENT/PLAN: Encounter Diagnoses  Name Primary?  . Irritation of right eye Yes  . Folliculitis    No fluoroscein uptake Rx Ocuflox QID x 5 days F/u prn otherwise in  October for fasting lipid, recheck cholesterol. Low fat diet.     Hamilton Capri PHUONG, DO 06/15/2013 6:52 PM

## 2013-12-06 ENCOUNTER — Ambulatory Visit (INDEPENDENT_AMBULATORY_CARE_PROVIDER_SITE_OTHER): Payer: BC Managed Care – PPO | Admitting: Physician Assistant

## 2013-12-06 VITALS — BP 118/78 | HR 86 | Temp 98.2°F | Resp 18 | Ht 60.0 in | Wt 139.0 lb

## 2013-12-06 DIAGNOSIS — L02419 Cutaneous abscess of limb, unspecified: Secondary | ICD-10-CM

## 2013-12-06 DIAGNOSIS — IMO0002 Reserved for concepts with insufficient information to code with codable children: Secondary | ICD-10-CM

## 2013-12-06 MED ORDER — DOXYCYCLINE HYCLATE 100 MG PO TABS: 100.0000 mg | ORAL_TABLET | Freq: Two times a day (BID) | ORAL | Status: DC

## 2013-12-06 NOTE — Progress Notes (Signed)
   Subjective:    Patient ID: Pamela Stanley, female    DOB: 07/23/1976, 38 y.o.   MRN: [id number]  HPI Pt presents to clinic with a bump under her right arm that over the last 2 days has gotten bigger and more painful.  She has done nothing to the area.  She has never had anything like this is the past.   Review of Systems  Constitutional: Negative for fever and chills.  HENT: Negative.   Skin: Positive for wound.       Objective:   Physical Exam  Vitals reviewed. Constitutional: She is oriented to person, place, and time. She appears well-developed and well-nourished.  HENT:  Head: Normocephalic and atraumatic.  Right Ear: External ear normal.  Left Ear: External ear normal.  Pulmonary/Chest: Effort normal.  Neurological: She is alert and oriented to person, place, and time.  Skin: Skin is warm and dry.  1.5 cm tender slightly erythematous nodules in the right axillary region.  There is induration along the edges and only minimal fluctuance in the center of the nodule.  Psychiatric: She has a normal mood and affect. Her behavior is normal. Judgment and thought content normal.          Assessment & Plan:  Abscess of axillary region - Plan: doxycycline (VIBRA-TABS) 100 MG tablet - Due to the size we are going to start with oral abx over the next 48h and warm compresses and if she is not better she will RTC for I&D.  Benny LennertSarah Weber PA-C 12/06/2013 9:30 PM

## 2013-12-06 NOTE — Patient Instructions (Signed)
Warm compresses the area under the arm.  It is not better in 48h return to the clinic.

## 2014-01-22 IMAGING — CR DG CERVICAL SPINE 2 OR 3 VIEWS
2 series · 2 of 2 positions shown · non-contrast
Comparison: None.

CLINICAL DATA: Arm/neck pain

CERVICAL SPINE - 2-3 VIEW

[lateral]
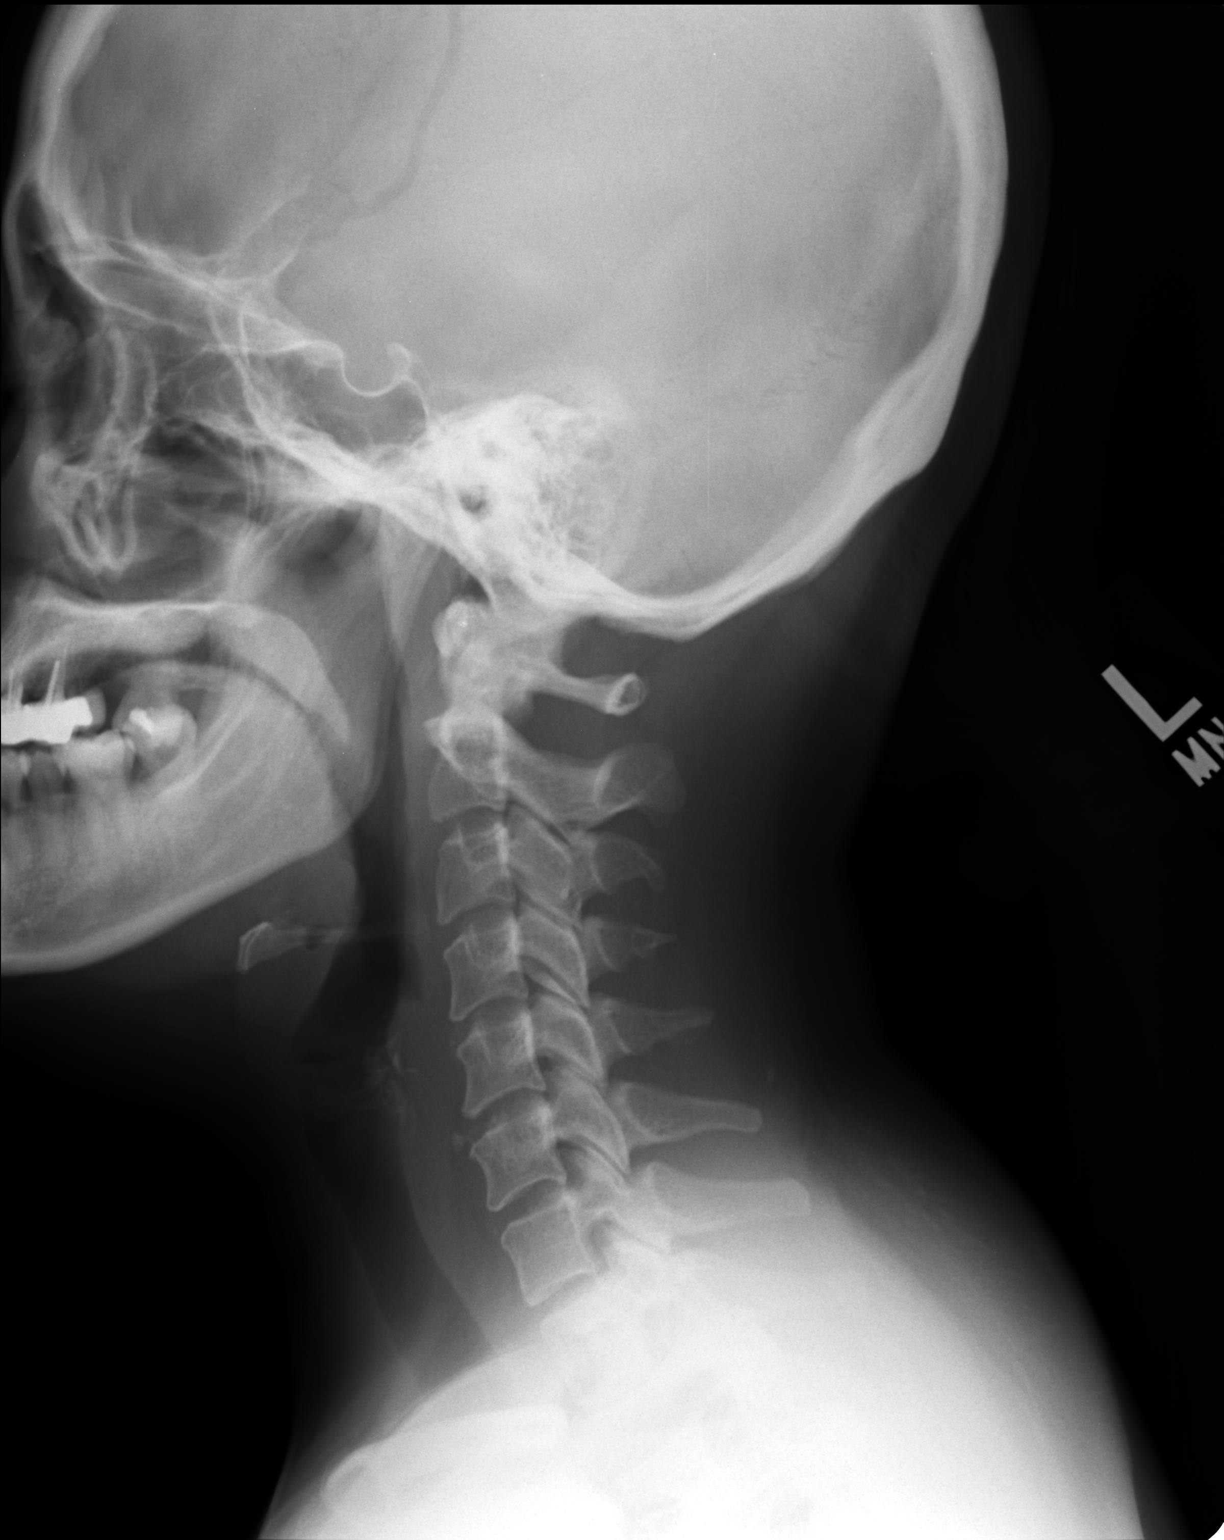

[AP]
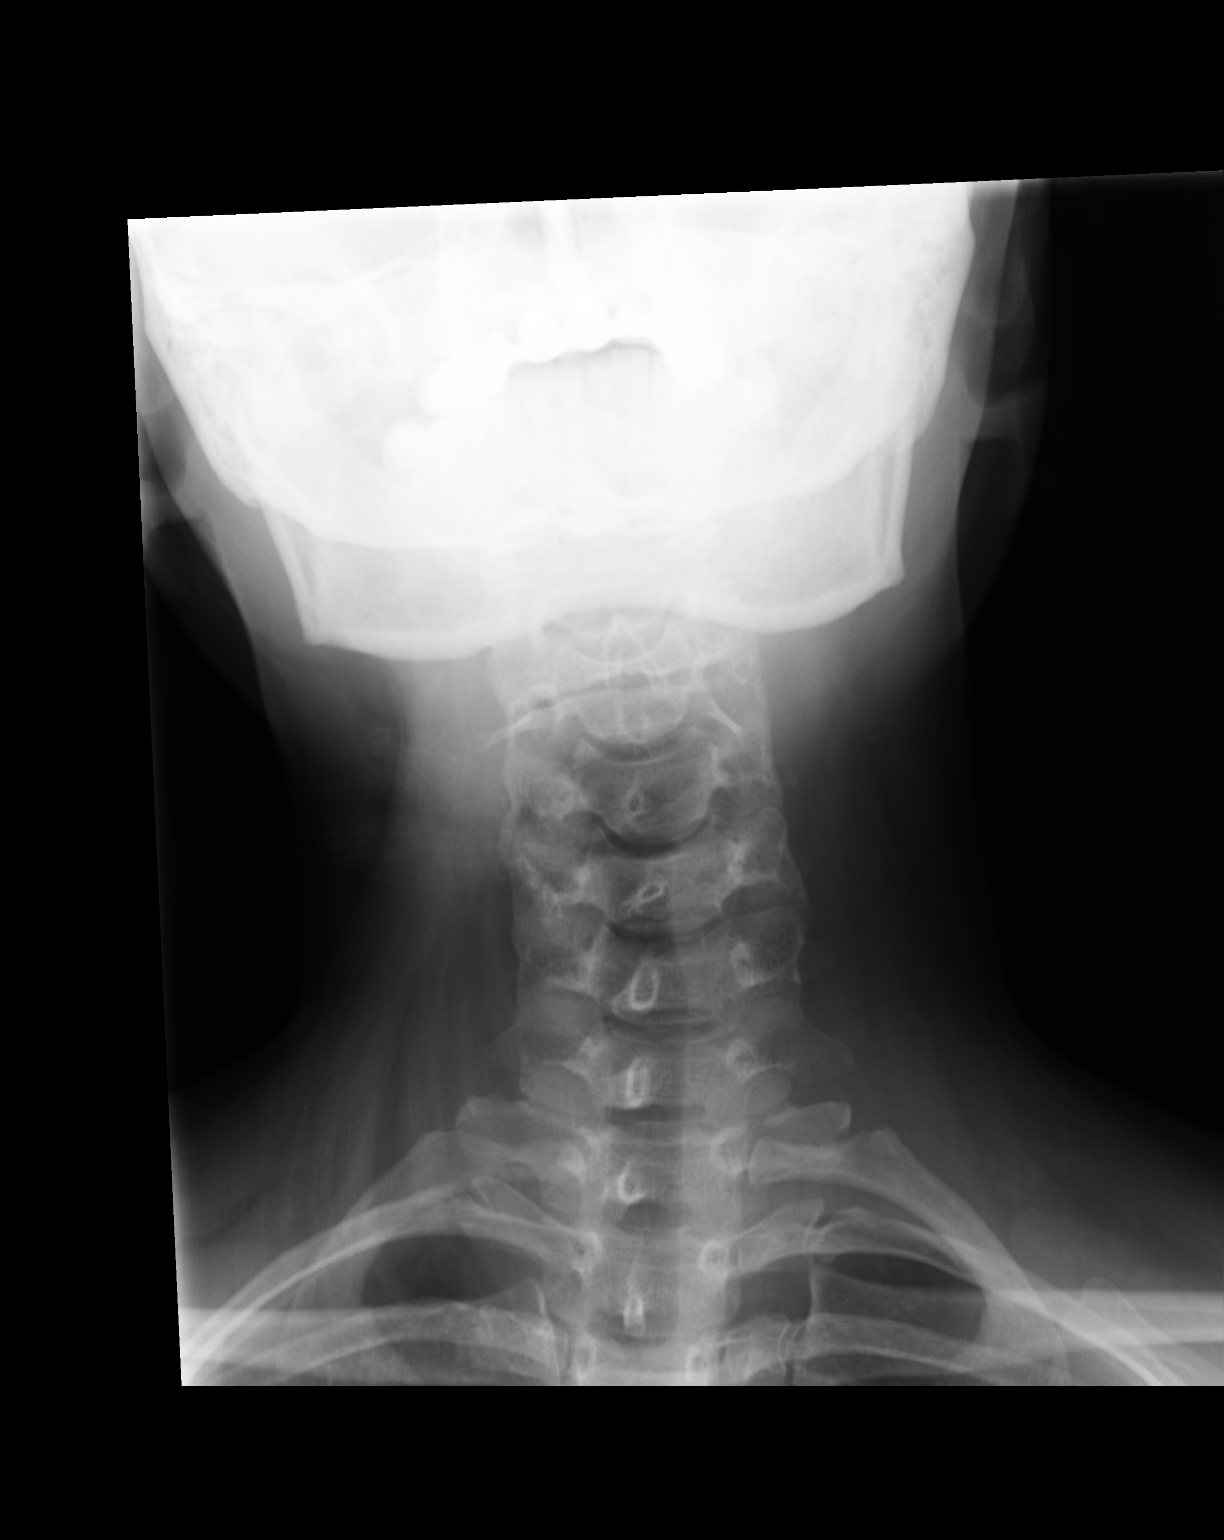

[2 of 2 positions shown; findings below may reference images not displayed]

FINDINGS: Cervical spine is visualized to C7-T1 on the lateral
view.

Normal cervical lordosis.

No evidence of fracture or dislocation.  Vertebral body heights and
intervertebral disc spaces are maintained.

No prevertebral soft tissue swelling.

Visualized lung apices are clear.

An odontoid view was not performed.
IMPRESSION: No fracture or dislocation is seen.

## 2015-01-24 ENCOUNTER — Ambulatory Visit (INDEPENDENT_AMBULATORY_CARE_PROVIDER_SITE_OTHER): Payer: BLUE CROSS/BLUE SHIELD | Admitting: Family Medicine

## 2015-01-24 VITALS — BP 114/80 | HR 100 | Temp 98.2°F | Resp 20 | Ht 60.75 in | Wt 142.4 lb

## 2015-01-24 DIAGNOSIS — R42 Dizziness and giddiness: Secondary | ICD-10-CM

## 2015-01-24 DIAGNOSIS — R51 Headache: Secondary | ICD-10-CM

## 2015-01-24 DIAGNOSIS — R030 Elevated blood-pressure reading, without diagnosis of hypertension: Secondary | ICD-10-CM

## 2015-01-24 DIAGNOSIS — R519 Headache, unspecified: Secondary | ICD-10-CM

## 2015-01-24 LAB — POCT CBC
Granulocyte percent: 66.3 %G (ref 37–80)
HCT, POC: 41.5 % (ref 37.7–47.9)
HEMOGLOBIN: 13.4 g/dL (ref 12.2–16.2)
Lymph, poc: 3 (ref 0.6–3.4)
MCH, POC: 29.9 pg (ref 27–31.2)
MCHC: 32.3 g/dL (ref 31.8–35.4)
MCV: 92.6 fL (ref 80–97)
MID (CBC): 0.2 (ref 0–0.9)
MPV: 6.9 fL (ref 0–99.8)
PLATELET COUNT, POC: 277 10*3/uL (ref 142–424)
POC GRANULOCYTE: 6.2 (ref 2–6.9)
POC LYMPH PERCENT: 31.5 %L (ref 10–50)
POC MID %: 2.2 % (ref 0–12)
RBC: 4.48 M/uL (ref 4.04–5.48)
RDW, POC: 12.8 %
WBC: 9.4 10*3/uL (ref 4.6–10.2)

## 2015-01-24 LAB — GLUCOSE, POCT (MANUAL RESULT ENTRY): POC GLUCOSE: 116 mg/dL — AB (ref 70–99)

## 2015-01-24 LAB — POCT GLYCOSYLATED HEMOGLOBIN (HGB A1C): HEMOGLOBIN A1C: 5.5

## 2015-01-24 MED ORDER — MECLIZINE HCL 25 MG PO TABS: ORAL_TABLET | ORAL | Status: DC

## 2015-01-24 NOTE — Patient Instructions (Addendum)
Drink more water  Take the meclizine one half to one tablet 3 times daily if needed for dizziness  Take Aleve (naproxen) 1 or 2 pills twice daily as needed for headache  Return if worse. I would expect this to improve over the next 3 or 4 days.  Monitor your blood pressure once or twice a week. It should stay below 140/90. Ideal is down around 120/70. Try and get regular exercise. Try to see if you can eat less and lose a little weight. Return if further problems.

## 2015-01-24 NOTE — Progress Notes (Signed)
:   Subjective: For the past several days patient has been having a headache. She has some dizziness and tingling in her arms. She checked her blood pressure at home and it was 138/92. No nausea or vomiting. She has had a little bit of tinnitus. Her last missed period was about 2 weeks ago. Does not think she is pregnant. They're not using contraception. She is not on any regular medications. She takes occasional Aleve.. They would like some labs done.   Objective: Moderately obese young lady in no acute distress. Accompanied by her husband who interpreted. She speaks some [place]. She is [place] been in the [place] 2 years.TMs are normal. Eyes PERRLA. Fundi benign. Throat clear. Neck supple without nodes thyromegaly. Chest clear process. Heart regular without murmurs. Neurological exam is normal. Romberg negative. Tandem walk normal. No palmar drift. Cranial nerves grossly intact. Deep tendon reflexes 2+, symmetrical in knees and ankles. Gait normal.  Assessment: Headache Dizziness Transient blood pressure elevation  Plan: CBC, hemoglobin A1c, TSH, comprehensive metabolic panel  Results for orders placed or performed in visit on 01/24/15  POCT CBC  Result Value Ref Range   WBC 9.4 4.6 - 10.2 K/uL   Lymph, poc 3.0 0.6 - 3.4   POC LYMPH PERCENT 31.5 10 - 50 %L   MID (cbc) 0.2 0 - 0.9   POC MID % 2.2 0 - 12 %M   POC Granulocyte 6.2 2 - 6.9   Granulocyte percent 66.3 37 - 80 %G   RBC 4.48 4.04 - 5.48 M/uL   Hemoglobin 13.4 12.2 - 16.2 g/dL   HCT, POC 16.141.5 09.637.7 - 47.9 %   MCV 92.6 80 - 97 fL   MCH, POC 29.9 27 - 31.2 pg   MCHC 32.3 31.8 - 35.4 g/dL   RDW, POC 04.512.8 %   Platelet Count, POC 277 142 - 424 K/uL   MPV 6.9 0 - 99.8 fL  POCT glucose (manual entry)  Result Value Ref Range   POC Glucose 116 (A) 70 - 99 mg/dl  POCT glycosylated hemoglobin (Hb A1C)  Result Value Ref Range   Hemoglobin A1C 5.5

## 2015-01-25 LAB — COMPLETE METABOLIC PANEL WITH GFR
ALBUMIN: 4.2 g/dL (ref 3.5–5.2)
ALK PHOS: 46 U/L (ref 39–117)
ALT: 14 U/L (ref 0–35)
AST: 13 U/L (ref 0–37)
BILIRUBIN TOTAL: 0.3 mg/dL (ref 0.2–1.2)
BUN: 9 mg/dL (ref 6–23)
CO2: 24 mEq/L (ref 19–32)
CREATININE: 0.69 mg/dL (ref 0.50–1.10)
Calcium: 9.1 mg/dL (ref 8.4–10.5)
Chloride: 103 mEq/L (ref 96–112)
GFR, Est African American: >89 mL/min
GLUCOSE: 110 mg/dL — AB (ref 70–99)
POTASSIUM: 4.1 meq/L (ref 3.5–5.3)
Sodium: 137 mEq/L (ref 135–145)
Total Protein: 7.4 g/dL (ref 6.0–8.3)

## 2015-01-25 LAB — TSH: TSH: 0.352 u[IU]/mL (ref 0.350–4.500)

## 2017-02-21 ENCOUNTER — Ambulatory Visit: Payer: BLUE CROSS/BLUE SHIELD

## 2017-09-25 ENCOUNTER — Encounter (HOSPITAL_COMMUNITY): Payer: Self-pay | Admitting: Emergency Medicine

## 2017-09-25 ENCOUNTER — Emergency Department (HOSPITAL_COMMUNITY): Payer: BLUE CROSS/BLUE SHIELD

## 2017-09-25 DIAGNOSIS — R030 Elevated blood-pressure reading, without diagnosis of hypertension: Secondary | ICD-10-CM | POA: Diagnosis present

## 2017-09-25 DIAGNOSIS — Z79899 Other long term (current) drug therapy: Secondary | ICD-10-CM | POA: Diagnosis not present

## 2017-09-25 DIAGNOSIS — N3000 Acute cystitis without hematuria: Secondary | ICD-10-CM | POA: Insufficient documentation

## 2017-09-25 LAB — BASIC METABOLIC PANEL
ANION GAP: 9 (ref 5–15)
BUN: 11 mg/dL (ref 6–20)
CHLORIDE: 102 mmol/L (ref 101–111)
CO2: 25 mmol/L (ref 22–32)
Calcium: 9.6 mg/dL (ref 8.9–10.3)
Creatinine, Ser: 0.65 mg/dL (ref 0.44–1.00)
Glucose, Bld: 125 mg/dL — ABNORMAL HIGH (ref 65–99)
POTASSIUM: 3.5 mmol/L (ref 3.5–5.1)
SODIUM: 136 mmol/L (ref 135–145)

## 2017-09-25 LAB — CBC
HCT: 40.2 % (ref 36.0–46.0)
HEMOGLOBIN: 13.6 g/dL (ref 12.0–15.0)
MCH: 29.6 pg (ref 26.0–34.0)
MCHC: 33.8 g/dL (ref 30.0–36.0)
MCV: 87.6 fL (ref 78.0–100.0)
Platelets: 251 10*3/uL (ref 150–400)
RBC: 4.59 MIL/uL (ref 3.87–5.11)
RDW: 12.3 % (ref 11.5–15.5)
WBC: 11.5 10*3/uL — AB (ref 4.0–10.5)

## 2017-09-25 LAB — I-STAT TROPONIN, ED: Troponin i, poc: 0 ng/mL (ref 0.00–0.08)

## 2017-09-25 NOTE — ED Triage Notes (Signed)
Pt c/o hypertension, denies hx, no medications. Also c/o chest discomfort yesterday, dizziness and headache today. A&O x 4.

## 2017-09-26 ENCOUNTER — Emergency Department (HOSPITAL_COMMUNITY)
Admission: EM | Admit: 2017-09-26 | Discharge: 2017-09-26 | Disposition: A | Discharge status: Home / Self Care | Payer: BLUE CROSS/BLUE SHIELD | Attending: Emergency Medicine | Admitting: Emergency Medicine

## 2017-09-26 DIAGNOSIS — N3000 Acute cystitis without hematuria: Secondary | ICD-10-CM

## 2017-09-26 DIAGNOSIS — R030 Elevated blood-pressure reading, without diagnosis of hypertension: Secondary | ICD-10-CM

## 2017-09-26 LAB — URINALYSIS, ROUTINE W REFLEX MICROSCOPIC
BILIRUBIN URINE: NEGATIVE
Glucose, UA: NEGATIVE mg/dL
Hgb urine dipstick: NEGATIVE
Ketones, ur: NEGATIVE mg/dL
NITRITE: NEGATIVE
PH: 5 (ref 5.0–8.0)
Protein, ur: NEGATIVE mg/dL
SPECIFIC GRAVITY, URINE: 1.023 (ref 1.005–1.030)

## 2017-09-26 LAB — POC URINE PREG, ED: PREG TEST UR: NEGATIVE

## 2017-09-26 MED ORDER — ACETAMINOPHEN 500 MG PO TABS
1000.0000 mg | ORAL_TABLET | Freq: Once | ORAL | Status: AC
  Administered 2017-09-26: 1000 mg via ORAL
  Filled 2017-09-26: qty 2

## 2017-09-26 MED ORDER — NITROFURANTOIN MONOHYD MACRO 100 MG PO CAPS: 100.0000 mg | ORAL_CAPSULE | Freq: Two times a day (BID) | ORAL | 0 refills | Status: DC

## 2017-09-26 MED ORDER — NITROFURANTOIN MONOHYD MACRO 100 MG PO CAPS
100.0000 mg | ORAL_CAPSULE | Freq: Once | ORAL | Status: AC
  Administered 2017-09-26: 100 mg via ORAL
  Filled 2017-09-26: qty 1

## 2017-09-26 NOTE — ED Provider Notes (Addendum)
[facility] EMERGENCY DEPARTMENT Provider Note   CSN: [id number] Arrival date & time: 09/25/17  2228     History   Chief Complaint Chief Complaint  Patient presents with  . Hypertension    HPI Pamela Stanley is a 41 y.o. female.  The history is provided by the patient.  Hypertension  This is a new problem. The current episode started more than 2 days ago. The problem occurs hourly. The problem has not changed since onset.Pertinent negatives include no chest pain, no abdominal pain, no headaches and no shortness of breath. Nothing aggravates the symptoms. Nothing relieves the symptoms. She has tried nothing for the symptoms. The treatment provided no relief.    Past Medical History:  Diagnosis Date  . GERD (gastroesophageal reflux disease)   . Ulcer     Patient Active Problem List   Diagnosis Date Noted  . Language barrier 01/03/2013    History reviewed. No pertinent surgical history.  OB History    No data available       Home Medications    Prior to Admission medications   Medication Sig Start Date End Date Taking? Authorizing Provider  ibuprofen (ADVIL,MOTRIN) 200 MG tablet Take 200 mg by mouth every 6 (six) hours as needed for moderate pain.    Yes [provider]  meclizine (ANTIVERT) 25 MG tablet Take one half to one tablet 3 times daily only if needed for dizziness 01/24/15  Yes Peyton Najjar, MD  nitrofurantoin, macrocrystal-monohydrate, (MACROBID) 100 MG capsule Take 1 capsule (100 mg total) by mouth 2 (two) times daily. X 7 days 09/26/17   Cy Blamer, MD    Family History Family History  Problem Relation Age of Onset  . Hypertension Father     Social History Social History  Substance Use Topics  . Smoking status: Never Smoker  . Smokeless tobacco: Never Used  . Alcohol use No     Allergies   Patient has no known allergies.   Review of Systems Review of Systems  Respiratory: Negative for shortness of  breath.   Cardiovascular: Negative for chest pain.  Gastrointestinal: Negative for abdominal pain.  Neurological: Negative for headaches.  Psychiatric/Behavioral: Negative for sleep disturbance.  All other systems reviewed and are negative.    Physical Exam Updated Vital Signs BP 123/89   Pulse 79   Temp 98.9 F (37.2 C) (Oral)   Resp (!) 21   LMP 09/06/2017   SpO2 100%   Physical Exam  Constitutional: She is oriented to person, place, and time. She appears well-developed and well-nourished. No distress.  HENT:  Head: Normocephalic and atraumatic.  Nose: Nose normal.  Mouth/Throat: No oropharyngeal exudate.  Eyes: Pupils are equal, round, and reactive to light. Conjunctivae and EOM are normal.  No proptosis intact cognition  Neck: Normal range of motion. Neck supple.  Cardiovascular: Normal rate, regular rhythm, normal heart sounds and intact distal pulses.   Pulmonary/Chest: Effort normal and breath sounds normal. No stridor. She has no wheezes. She has no rales.  Abdominal: Soft. Bowel sounds are normal. She exhibits no mass. There is no tenderness. There is no rebound and no guarding.  Musculoskeletal: Normal range of motion.  Lymphadenopathy:    She has no cervical adenopathy.  Neurological: She is alert and oriented to person, place, and time. She displays normal reflexes. No cranial nerve deficit. She exhibits normal muscle tone.  Skin: Skin is warm and dry. Capillary refill takes less than 2 seconds.  Psychiatric: She  has a normal mood and affect.     ED Treatments / Results   Vitals:   09/26/17 0600 09/26/17 0615  BP: 121/85 123/89  Pulse: 79 79  Resp: (!) 21 (!) 21  Temp:    SpO2: 98% 100%    Labs (all labs ordered are listed, but only abnormal results are displayed)  Results for orders placed or performed during the hospital encounter of 09/26/17  Basic metabolic panel  Result Value Ref Range   Sodium 136 135 - 145 mmol/L   Potassium 3.5 3.5 - 5.1  mmol/L   Chloride 102 101 - 111 mmol/L   CO2 25 22 - 32 mmol/L   Glucose, Bld 125 (H) 65 - 99 mg/dL   BUN 11 6 - 20 mg/dL   Creatinine, Ser 4.09 0.44 - 1.00 mg/dL   Calcium 9.6 8.9 - 81.1 mg/dL   GFR calc non Af Amer >60 >60 mL/min   GFR calc Af Amer >60 >60 mL/min   Anion gap 9 5 - 15  CBC  Result Value Ref Range   WBC 11.5 (H) 4.0 - 10.5 K/uL   RBC 4.59 3.87 - 5.11 MIL/uL   Hemoglobin 13.6 12.0 - 15.0 g/dL   HCT 91.4 78.2 - 95.6 %   MCV 87.6 78.0 - 100.0 fL   MCH 29.6 26.0 - 34.0 pg   MCHC 33.8 30.0 - 36.0 g/dL   RDW 21.3 08.6 - 57.8 %   Platelets 251 150 - 400 K/uL  Urinalysis, Routine w reflex microscopic  Result Value Ref Range   Color, Urine YELLOW YELLOW   APPearance CLOUDY (A) CLEAR   Specific Gravity, Urine 1.023 1.005 - 1.030   pH 5.0 5.0 - 8.0   Glucose, UA NEGATIVE NEGATIVE mg/dL   Hgb urine dipstick NEGATIVE NEGATIVE   Bilirubin Urine NEGATIVE NEGATIVE   Ketones, ur NEGATIVE NEGATIVE mg/dL   Protein, ur NEGATIVE NEGATIVE mg/dL   Nitrite NEGATIVE NEGATIVE   Leukocytes, UA LARGE (A) NEGATIVE   RBC / HPF 0-5 0 - 5 RBC/hpf   WBC, UA TOO NUMEROUS TO COUNT 0 - 5 WBC/hpf   Bacteria, UA RARE (A) NONE SEEN   Squamous Epithelial / LPF 6-30 (A) NONE SEEN   Mucus PRESENT   I-stat troponin, ED  Result Value Ref Range   Troponin i, poc 0.00 0.00 - 0.08 ng/mL   Comment 3           Dg Chest 2 View  Result Date: 09/25/2017 CLINICAL DATA:  Chest discomfort, dizziness, hypertension and tachycardia for 2 days. EXAM: CHEST  2 VIEW COMPARISON:  03/26/2013. FINDINGS: Normal sized heart. Clear lungs with normal vascularity. Minimal peribronchial thickening. Minimal scoliosis. Calcified loose body adjacent to the right humeral neck. IMPRESSION: Minimal bronchitic changes. Electronically Signed   By: Beckie Salts M.D.   On: 09/25/2017 23:17    EKG  EKG Interpretation  Date/Time:  Thursday September 25 2017 22:38:50 EDT Ventricular Rate:  97 PR Interval:  138 QRS  Duration: 70 QT Interval:  338 QTC Calculation: 429 R Axis:   38 Text Interpretation:  Normal sinus rhythm Confirmed by Nicanor Alcon, Dinara Lupu ([id number]) on 09/26/2017 4:33:23 AM       Radiology Dg Chest 2 View  Result Date: 09/25/2017 CLINICAL DATA:  Chest discomfort, dizziness, hypertension and tachycardia for 2 days. EXAM: CHEST  2 VIEW COMPARISON:  03/26/2013. FINDINGS: Normal sized heart. Clear lungs with normal vascularity. Minimal peribronchial thickening. Minimal scoliosis. Calcified loose body adjacent to the right  humeral neck. IMPRESSION: Minimal bronchitic changes. Electronically Signed   By: Beckie SaltsSteven  Reid M.D.   On: 09/25/2017 23:17    Procedures Procedures (including critical care time)  Medications Ordered in ED Medications  nitrofurantoin (macrocrystal-monohydrate) (MACROBID) capsule 100 mg (not administered)  acetaminophen (TYLENOL) tablet 1,000 mg (1,000 mg Oral Given 09/26/17 0604)       Final Clinical Impressions(s) / ED Diagnoses   Final diagnoses:  Single episode of elevated blood pressure  Acute cystitis without hematuria  BP issues resolved on own.  > 10 minutes spent in consultation.  No indication for BP meds as BP already normal at the time of evaluation.  I suspect salt and stress as elevation.  Will place patient on DASH diet and have patient follow up with PMD for repeat check.  Treating for UTI.  HA resolved.  CN 2-12 intact and I doubt acute intracranial pathology.    Will have patient follow up with her PMD and cardiology as an outpatient.  Strict return precautions given.  Strict return precautions given for  Shortness of breath, swelling or the lips or tongue, chest pain, dyspnea on exertion, new weakness or numbness changes in vision or speech,  Inability to tolerate liquids or food, changes in voice cough, altered mental status or any concerns. No signs of systemic illness or infection. The patient is nontoxic-appearing on exam and vital signs are within  normal limits.    I have reviewed the triage vital signs and the nursing notes. Pertinent labs &imaging results that were available during my care of the patient were reviewed by me and considered in my medical decision making (see chart for details).  After history, exam, and medical workup I feel the patient has been appropriately medically screened and is safe for discharge home. Pertinent diagnoses were discussed with the patient. Patient was given return precautions.    New Prescriptions New Prescriptions   NITROFURANTOIN, MACROCRYSTAL-MONOHYDRATE, (MACROBID) 100 MG CAPSULE    Take 1 capsule (100 mg total) by mouth 2 (two) times daily. X 7 days     Cy BlamerPalumbo, Anijah Spohr, MD 09/26/17 Willadean [id number]    Sareen Randon, MD 09/26/17 [id number]    Cy BlamerPalumbo, Tabb Croghan, MD 09/27/17 0001

## 2017-09-26 NOTE — ED Notes (Signed)
Pt POC U-preg test NEGATIVE. Results not crossing over. Information is on glucometer

## 2018-07-21 ENCOUNTER — Ambulatory Visit (INDEPENDENT_AMBULATORY_CARE_PROVIDER_SITE_OTHER): Payer: BLUE CROSS/BLUE SHIELD | Admitting: Physician Assistant

## 2018-07-21 ENCOUNTER — Other Ambulatory Visit: Payer: Self-pay

## 2018-07-21 ENCOUNTER — Encounter: Payer: Self-pay | Admitting: Physician Assistant

## 2018-07-21 VITALS — BP 110/80 | HR 83 | Temp 98.4°F | Resp 16 | Ht 60.0 in | Wt 146.0 lb

## 2018-07-21 DIAGNOSIS — M545 Low back pain, unspecified: Secondary | ICD-10-CM

## 2018-07-21 DIAGNOSIS — R21 Rash and other nonspecific skin eruption: Secondary | ICD-10-CM | POA: Diagnosis not present

## 2018-07-21 LAB — POCT URINALYSIS DIP (MANUAL ENTRY)
Bilirubin, UA: NEGATIVE
Blood, UA: NEGATIVE
Glucose, UA: NEGATIVE mg/dL
Ketones, POC UA: NEGATIVE mg/dL
Nitrite, UA: NEGATIVE
Protein Ur, POC: NEGATIVE mg/dL
Spec Grav, UA: 1.02 (ref 1.010–1.025)
Urobilinogen, UA: 0.2 E.U./dL
pH, UA: 7.5 (ref 5.0–8.0)

## 2018-07-21 MED ORDER — MELOXICAM 7.5 MG PO TABS: 7.5000 mg | ORAL_TABLET | Freq: Every day | ORAL | 1 refills | Status: DC

## 2018-07-21 MED ORDER — CYCLOBENZAPRINE HCL 10 MG PO TABS: 10.0000 mg | ORAL_TABLET | Freq: Three times a day (TID) | ORAL | 1 refills | Status: DC | PRN

## 2018-07-21 MED ORDER — TRIAMCINOLONE ACETONIDE 0.1 % EX CREA: 1.0000 "application " | TOPICAL_CREAM | Freq: Two times a day (BID) | CUTANEOUS | 0 refills | Status: DC

## 2018-07-21 NOTE — Patient Instructions (Addendum)
Meloxiam l m?t NSAID. ?y l m?t thu?c ch?ng vim. Khng s? d?ng v?i b?t k? lo?i thu?c gi?m ?au no khc ngoi tylenol / acetaminophen - v v?y khng c aleve, ibuprofen, motrin, advil, v.v.   Flexeril l m?t lo?i thu?c gin c?. ?i?u ny c th? lm b?n bu?n ng?. ??ng l?y ci ny v li xe ho?c lm vi?c. B?n c th? dng hai lo?i thu?c ny cng m?t lc n?u c?n.  p d?ng nhi?t ?m cho khu v?c. Lm ??t m?t chi?c kh?n v v?t n ra ?? n ?m ??t. ??t n trong l vi sng trong kho?ng 15-20 giy - ?? lu ?? lm cho n nng, nh?ng khng qu nng ?? p d?ng cho da c?a b?n gy b?ng. Lm ?i?u ny trong kho?ng 20-30 pht, 3-4 l?n m?t ngy.  Duy tr m?t t? th? t?t (ng?i th?ng) trong khi lm vi?c.  Th?c hi?n nh? nhng, ko di nh? 2-3 l?n m?t ngy.  ??t m?t qu? bng tennis gi?a l?ng v m?t b?c t??ng. Massage nh? nhng khu v?c v?i l?n bng xung quanh khu v?c b? ?nh h??ng. Hy th? m?t con l?n b?t ho?c mt xa ?i?m kch ho?t.  Gi? ?? n??c - c? g?ng u?ng 32-64 oz / ngy.  --------------------------------------------------------------------- Meloxiam is an NSAID. This is an antiinflammatory.  Do not use with any other over the counter pain medication other than tylenol/acetaminophen - so no aleve, ibuprofen, motrin, advil, etc.  Flexeril is a muscle relaxer. This may make you drowsy. Do not take this and drive or work.  You can take these two medicines at the same time if needed.   Apply moist heat to the area. Wet a towel and wring it out so it is damp. Put it in the microwave for about 15-20 seconds - long enough to make it hot, but not too hot to apply to your skin causing burns. Do this for about 20-30 minutes, 3-4 times a day.   Perform gentle, light stretches 2-3 times a day.   Put a tennis ball between your back and a wall. Gentle massage the area with rolling the ball around the affected area. Try a foam roller or trigger point massager.    Stay well hydrated - try to drink 32-64 oz/day.    IF you  received an x-ray today, you will receive an invoice from [facility]. Please contact [facility] at [phone] with questions or concerns regarding your invoice.   IF you received labwork today, you will receive an invoice from [facility]. Please contact [facility] at [phone] with questions or concerns regarding your invoice.   Our billing staff will not be able to assist you with questions regarding bills from these companies.  You will be contacted with the lab results as soon as they are available. The fastest way to get your results is to activate your My Chart account. Instructions are located on the last page of this paperwork. If you have not heard from us regarding the results in 2 weeks, please contact this office.       IF you received an x-ray today, you will receive an invoice from [facility]. Please contact [facility] at [phone] with questions or concerns regarding your invoice.   IF you received labwork today, you will receive an invoice from [facility]. Please contact [facility] at [phone] with questions or concerns regarding your invoice.   Our billing staff will not be able to assist you with questions regarding bills from these companies.  You will be contacted with the lab results as soon as they are available. The fastest way to get your results is to activate your My Chart account. Instructions are located on the last page of this paperwork. If you have not heard from [place] regarding the results in 2 weeks, please contact this office.

## 2018-07-21 NOTE — Progress Notes (Signed)
Pamela Stanley  MRN: [id number] DOB: 12-Mar-1976  PCP: Lenell AntuLe, Thao P, DO  Subjective:  Pt is a 42 year old female who presents to clinic for back pain x 1 month. She speaks [place], she is here today with her husband who is interpreting.  Pain is of her mid back right of her spine, "comes and goes". Nothing makes it better or worse. Pain does not radiate. Feels like a "sharp prick". Pain does not wake her up at night.  She has not taken anything to feel better. She has not tried heat or stretching.  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  Pain is not present currently.   She would like refill of triamcinolone 1% for rash on her hands.   Review of Systems  Constitutional: Negative for chills, fatigue and fever.  Respiratory: Negative for cough, shortness of breath and wheezing.   Cardiovascular: Negative for chest pain and palpitations.  Gastrointestinal: Negative for abdominal pain, diarrhea, nausea and vomiting.  Genitourinary: Negative for decreased urine volume, difficulty urinating, dysuria, enuresis, flank pain, frequency, hematuria and urgency.  Musculoskeletal: Positive for back pain.  Neurological: Negative for dizziness, weakness, light-headedness and headaches.   Patient Active Problem List   Diagnosis Date Noted  . Language barrier 01/03/2013    Current Outpatient Medications on File Prior to Visit  Medication Sig Dispense Refill  . ibuprofen (ADVIL,MOTRIN) 200 MG tablet Take 200 mg by mouth every 6 (six) hours as needed.     No current facility-administered medications on file prior to visit.     No Known Allergies   Objective:  BP 110/80 (BP Location: Left Arm, Patient Position: Sitting, Cuff Size: Normal)   Pulse 83   Temp 98.4 F (36.9 C) (Oral)   Resp 16   Ht 5' (1.524 m)   Wt 146 lb (66.2 kg)   LMP 06/24/2018   SpO2 95%   BMI 28.51 kg/m   Physical Exam  Constitutional: She is oriented to person, place, and time. No distress.  Musculoskeletal:       Thoracic  back: She exhibits normal range of motion, no tenderness and no bony tenderness.       Lumbar back: She exhibits normal range of motion, no tenderness and no bony tenderness.  Neurological: She is alert and oriented to person, place, and time.  Skin: Skin is warm and dry.  Psychiatric: Judgment normal.  Vitals reviewed.   Results for orders placed or performed in visit on 07/21/18  POCT urinalysis dipstick  Result Value Ref Range   Color, UA yellow yellow   Clarity, UA clear clear   Glucose, UA negative negative mg/dL   Bilirubin, UA negative negative   Ketones, POC UA negative negative mg/dL   Spec Grav, UA [id number] 8.4691.010 - 1.025   Blood, UA negative negative   pH, UA 7.5 5.0 - 8.0   Protein Ur, POC negative negative mg/dL   Urobilinogen, UA 0.2 0.2 or 1.0 E.U./dL   Nitrite, UA Negative Negative   Leukocytes, UA Trace (A) Negative    Assessment and Plan :  1. Right-sided low back pain without sciatica, unspecified chronicity - Pt presents for intermittent back pain. No red flags. HPI suggests work-related MSK pain. Advised heat, massage, stretching, hydration. Medication side effect profile discussed and printed out. RTC in 1 month for annual exam. - POCT urinalysis dipstick - cyclobenzaprine (FLEXERIL) 10 MG tablet; Take 1 tablet (10 mg total) by mouth 3 (three) times daily as needed for muscle spasms.  Dispense: 30 tablet; Refill: 1 - meloxicam (MOBIC) 7.5 MG tablet; Take 1-2 tablets (7.5-15 mg total) by mouth daily.  Dispense: 30 tablet; Refill: 1  2. Rash and nonspecific skin eruption - triamcinolone cream (KENALOG) 0.1 %; Apply 1 application topically 2 (two) times daily.  Dispense: 30 g; Refill: 0   Whitney Zariah Jost, PA-C  [facility] at [facility] 07/21/2018 9:12 AM  Please note: Portions of this report may have been transcribed using dragon voice recognition software. Every effort was made to ensure accuracy; however, inadvertent computerized  transcription errors may be present.

## 2018-08-07 ENCOUNTER — Telehealth: Payer: Self-pay | Admitting: Physician Assistant

## 2018-08-07 NOTE — Telephone Encounter (Signed)
Left a VM in regards to her appt she has with [facility] on 08/27/2018. The provider will not be in the office on that day and would need to be rescheduled.

## 2018-08-27 ENCOUNTER — Encounter: Payer: BLUE CROSS/BLUE SHIELD | Admitting: Physician Assistant

## 2018-09-03 ENCOUNTER — Other Ambulatory Visit: Payer: Self-pay

## 2018-09-03 ENCOUNTER — Encounter: Payer: Self-pay | Admitting: Physician Assistant

## 2018-09-03 ENCOUNTER — Ambulatory Visit (INDEPENDENT_AMBULATORY_CARE_PROVIDER_SITE_OTHER): Payer: BLUE CROSS/BLUE SHIELD | Admitting: Physician Assistant

## 2018-09-03 VITALS — BP 131/88 | HR 70 | Temp 97.9°F | Resp 18 | Ht 60.04 in | Wt 148.2 lb

## 2018-09-03 DIAGNOSIS — Z114 Encounter for screening for human immunodeficiency virus [HIV]: Secondary | ICD-10-CM

## 2018-09-03 DIAGNOSIS — Z124 Encounter for screening for malignant neoplasm of cervix: Secondary | ICD-10-CM | POA: Diagnosis not present

## 2018-09-03 DIAGNOSIS — Z1321 Encounter for screening for nutritional disorder: Secondary | ICD-10-CM

## 2018-09-03 DIAGNOSIS — N898 Other specified noninflammatory disorders of vagina: Secondary | ICD-10-CM

## 2018-09-03 DIAGNOSIS — Z13228 Encounter for screening for other metabolic disorders: Secondary | ICD-10-CM

## 2018-09-03 DIAGNOSIS — Z1329 Encounter for screening for other suspected endocrine disorder: Secondary | ICD-10-CM | POA: Diagnosis not present

## 2018-09-03 DIAGNOSIS — R05 Cough: Secondary | ICD-10-CM

## 2018-09-03 DIAGNOSIS — R059 Cough, unspecified: Secondary | ICD-10-CM

## 2018-09-03 DIAGNOSIS — Z0000 Encounter for general adult medical examination without abnormal findings: Secondary | ICD-10-CM

## 2018-09-03 DIAGNOSIS — Z130 Encounter for screening for diseases of the blood and blood-forming organs and certain disorders involving the immune mechanism: Secondary | ICD-10-CM

## 2018-09-03 LAB — POCT WET + KOH PREP
Trich by wet prep: ABSENT
Yeast by KOH: ABSENT
Yeast by wet prep: ABSENT

## 2018-09-03 LAB — POCT URINALYSIS DIP (MANUAL ENTRY)
Bilirubin, UA: NEGATIVE
Blood, UA: NEGATIVE
Glucose, UA: NEGATIVE mg/dL
Ketones, POC UA: NEGATIVE mg/dL
Leukocytes, UA: NEGATIVE
Nitrite, UA: NEGATIVE
Protein Ur, POC: NEGATIVE mg/dL
Spec Grav, UA: 1.01 (ref 1.010–1.025)
Urobilinogen, UA: 0.2 E.U./dL
pH, UA: 7.5 (ref 5.0–8.0)

## 2018-09-03 MED ORDER — HYDROCODONE-HOMATROPINE 5-1.5 MG/5ML PO SYRP: 5.0000 mL | ORAL_SOLUTION | Freq: Three times a day (TID) | ORAL | 0 refills | Status: DC | PRN

## 2018-09-03 NOTE — Patient Instructions (Addendum)
Make an appointment at [facility] [phone], [address], [place], [place] [address]  Make an appointment to see a dentist for routine teeth cleaning.   We will contact you with your lab results.   Health Maintenance, Female Adopting a healthy lifestyle and getting preventive care can go a long way to promote health and wellness. Talk with your health care provider about what schedule of regular examinations is right for you. This is a good chance for you to check in with your provider about disease prevention and staying healthy. In between checkups, there are plenty of things you can do on your own. Experts have done a lot of research about which lifestyle changes and preventive measures are most likely to keep you healthy. Ask your health care provider for more information. Weight and diet Eat a healthy diet  Be sure to include plenty of vegetables, fruits, low-fat dairy products, and lean protein.  Do not eat a lot of foods high in solid fats, added sugars, or salt.  Get regular exercise. This is one of the most important things you can do for your health. ? Most adults should exercise for at least 150 minutes each week. The exercise should increase your heart rate and make you sweat (moderate-intensity exercise). ? Most adults should also do strengthening exercises at least twice a week. This is in addition to the moderate-intensity exercise.  Maintain a healthy weight  Body mass index (BMI) is a measurement that can be used to identify possible weight problems. It estimates body fat based on height and weight. Your health care provider can help determine your BMI and help you achieve or maintain a healthy weight.  For females 66 years of age and older: ? A BMI below 18.5 is considered underweight. ? A BMI of 18.5 to 24.9 is normal. ? A BMI of 25 to 29.9 is considered overweight. ? A BMI of 30 and above is considered obese.  Watch levels of  cholesterol and blood lipids  You should start having your blood tested for lipids and cholesterol at 41 years of age, then have this test every 5 years.  You may need to have your cholesterol levels checked more often if: ? Your lipid or cholesterol levels are high. ? You are older than 42 years of age. ? You are at high risk for heart disease.  Cancer screening Lung Cancer  Lung cancer screening is recommended for adults 86-86 years old who are at high risk for lung cancer because of a history of smoking.  A yearly low-dose CT scan of the lungs is recommended for people who: ? Currently smoke. ? Have quit within the past 15 years. ? Have at least a 30-pack-year history of smoking. A pack year is smoking an average of one pack of cigarettes a day for 1 year.  Yearly screening should continue until it has been 15 years since you quit.  Yearly screening should stop if you develop a health problem that would prevent you from having lung cancer treatment.  Breast Cancer  Practice breast self-awareness. This means understanding how your breasts normally appear and feel.  It also means doing regular breast self-exams. Let your health care provider know about any changes, no matter how small.  If you are in your 20s or 30s, you should have a clinical breast exam (CBE) by a health care provider every 1-3 years as part of a regular health exam.  If you are 40 or  older, have a CBE every year. Also consider having a breast X-ray (mammogram) every year.  If you have a family history of breast cancer, talk to your health care provider about genetic screening.  If you are at high risk for breast cancer, talk to your health care provider about having an MRI and a mammogram every year.  Breast cancer gene (BRCA) assessment is recommended for women who have family members with BRCA-related cancers. BRCA-related cancers include: ? Breast. ? Ovarian. ? Tubal. ? Peritoneal cancers.  Results  of the assessment will determine the need for genetic counseling and BRCA1 and BRCA2 testing.  Cervical Cancer Your health care provider may recommend that you be screened regularly for cancer of the pelvic organs (ovaries, uterus, and vagina). This screening involves a pelvic examination, including checking for microscopic changes to the surface of your cervix (Pap test). You may be encouraged to have this screening done every 3 years, beginning at age 69.  For women ages 46-65, health care providers may recommend pelvic exams and Pap testing every 3 years, or they may recommend the Pap and pelvic exam, combined with testing for human papilloma virus (HPV), every 5 years. Some types of HPV increase your risk of cervical cancer. Testing for HPV may also be done on women of any age with unclear Pap test results.  Other health care providers may not recommend any screening for nonpregnant women who are considered low risk for pelvic cancer and who do not have symptoms. Ask your health care provider if a screening pelvic exam is right for you.  If you have had past treatment for cervical cancer or a condition that could lead to cancer, you need Pap tests and screening for cancer for at least 20 years after your treatment. If Pap tests have been discontinued, your risk factors (such as having a new sexual partner) need to be reassessed to determine if screening should resume. Some women have medical problems that increase the chance of getting cervical cancer. In these cases, your health care provider may recommend more frequent screening and Pap tests.  Colorectal Cancer  This type of cancer can be detected and often prevented.  Routine colorectal cancer screening usually begins at 42 years of age and continues through 42 years of age.  Your health care provider may recommend screening at an earlier age if you have risk factors for colon cancer.  Your health care provider may also recommend using  home test kits to check for hidden blood in the stool.  A small camera at the end of a tube can be used to examine your colon directly (sigmoidoscopy or colonoscopy). This is done to check for the earliest forms of colorectal cancer.  Routine screening usually begins at age 93.  Direct examination of the colon should be repeated every 5-10 years through 42 years of age. However, you may need to be screened more often if early forms of precancerous polyps or small growths are found.  Skin Cancer  Check your skin from head to toe regularly.  Tell your health care provider about any new moles or changes in moles, especially if there is a change in a mole's shape or color.  Also tell your health care provider if you have a mole that is larger than the size of a pencil eraser.  Always use sunscreen. Apply sunscreen liberally and repeatedly throughout the day.  Protect yourself by wearing long sleeves, pants, a wide-brimmed hat, and sunglasses whenever you are outside.  Heart disease, diabetes, and high blood pressure  High blood pressure causes heart disease and increases the risk of stroke. High blood pressure is more likely to develop in: ? People who have blood pressure in the high end of the normal range (130-139/85-89 mm Hg). ? People who are overweight or obese. ? People who are African American.  If you are 13-66 years of age, have your blood pressure checked every 3-5 years. If you are 65 years of age or older, have your blood pressure checked every year. You should have your blood pressure measured twice-once when you are at a hospital or clinic, and once when you are not at a hospital or clinic. Record the average of the two measurements. To check your blood pressure when you are not at a hospital or clinic, you can use: ? An automated blood pressure machine at a pharmacy. ? A home blood pressure monitor.  If you are between 3 years and 60 years old, ask your health care provider  if you should take aspirin to prevent strokes.  Have regular diabetes screenings. This involves taking a blood sample to check your fasting blood sugar level. ? If you are at a normal weight and have a low risk for diabetes, have this test once every three years after 42 years of age. ? If you are overweight and have a high risk for diabetes, consider being tested at a younger age or more often. Preventing infection Hepatitis B  If you have a higher risk for hepatitis B, you should be screened for this virus. You are considered at high risk for hepatitis B if: ? You were born in a country where hepatitis B is common. Ask your health care provider which countries are considered high risk. ? Your parents were born in a high-risk country, and you have not been immunized against hepatitis B (hepatitis B vaccine). ? You have HIV or AIDS. ? You use needles to inject street drugs. ? You live with someone who has hepatitis B. ? You have had sex with someone who has hepatitis B. ? You get hemodialysis treatment. ? You take certain medicines for conditions, including cancer, organ transplantation, and autoimmune conditions.  Hepatitis C  Blood testing is recommended for: ? Everyone born from [birth year] through 1965. ? Anyone with known risk factors for hepatitis C.  Sexually transmitted infections (STIs)  You should be screened for sexually transmitted infections (STIs) including gonorrhea and chlamydia if: ? You are sexually active and are younger than 42 years of age. ? You are older than 42 years of age and your health care provider tells you that you are at risk for this type of infection. ? Your sexual activity has changed since you were last screened and you are at an increased risk for chlamydia or gonorrhea. Ask your health care provider if you are at risk.  If you do not have HIV, but are at risk, it may be recommended that you take a prescription medicine daily to prevent HIV infection. This  is called pre-exposure prophylaxis (PrEP). You are considered at risk if: ? You are sexually active and do not regularly use condoms or know the HIV status of your partner(s). ? You take drugs by injection. ? You are sexually active with a partner who has HIV.  Talk with your health care provider about whether you are at high risk of being infected with HIV. If you choose to begin PrEP, you should first be tested for HIV.  You should then be tested every 3 months for as long as you are taking PrEP. Pregnancy  If you are premenopausal and you may become pregnant, ask your health care provider about preconception counseling.  If you may become pregnant, take 400 to 800 micrograms (mcg) of folic acid every day.  If you want to prevent pregnancy, talk to your health care provider about birth control (contraception). Osteoporosis and menopause  Osteoporosis is a disease in which the bones lose minerals and strength with aging. This can result in serious bone fractures. Your risk for osteoporosis can be identified using a bone density scan.  If you are 69 years of age or older, or if you are at risk for osteoporosis and fractures, ask your health care provider if you should be screened.  Ask your health care provider whether you should take a calcium or vitamin D supplement to lower your risk for osteoporosis.  Menopause may have certain physical symptoms and risks.  Hormone replacement therapy may reduce some of these symptoms and risks. Talk to your health care provider about whether hormone replacement therapy is right for you. Follow these instructions at home:  Schedule regular health, dental, and eye exams.  Stay current with your immunizations.  Do not use any tobacco products including cigarettes, chewing tobacco, or electronic cigarettes.  If you are pregnant, do not drink alcohol.  If you are breastfeeding, limit how much and how often you drink alcohol.  Limit alcohol intake  to no more than 1 drink per day for nonpregnant women. One drink equals 12 ounces of beer, 5 ounces of wine, or 1 ounces of hard liquor.  Do not use street drugs.  Do not share needles.  Ask your health care provider for help if you need support or information about quitting drugs.  Tell your health care provider if you often feel depressed.  Tell your health care provider if you have ever been abused or do not feel safe at home. This information is not intended to replace advice given to you by your health care provider. Make sure you discuss any questions you have with your health care provider. Document Released: 06/03/2011 Document Revised: 04/25/2016 Document Reviewed: 08/22/2015 Elsevier Interactive Patient Education  2018 [employer].   IF you received an x-ray today, you will receive an invoice from [facility]. Please contact [facility] at [phone] with questions or concerns regarding your invoice.   IF you received labwork today, you will receive an invoice from [facility]. Please contact [facility] at [phone] with questions or concerns regarding your invoice.   Our billing staff will not be able to assist you with questions regarding bills from these companies.  You will be contacted with the lab results as soon as they are available. The fastest way to get your results is to activate your My Chart account. Instructions are located on the last page of this paperwork. If you have not heard from [place] regarding the results in 2 weeks, please contact this office.

## 2018-09-03 NOTE — Progress Notes (Signed)
Primary Care at [facility] [address] [place] [address] [phone]  Date:  09/03/2018   Name:  Pamela Stanley   DOB:  March 21, 1976   MRN:  [id number]  PCP:  Sebastian Ache, PA-C    Chief Complaint: Annual Exam   History of Present Illness:  This is a 42 y.o. female with PMH none who is presenting for CPE. She is here today with her husband who is helping interpret.  Last CPE 2014  Works as a Radio broadcast assistant.  Complaints:  Recent cold and cough symptoms x 3 days.  Irregular periods. She has been trying to conceive x 2 years. She has intercourse 2x/week. . Periods are becoming irregular. Never been to GYN or fertility clinic LMP: 08/07/2018  Contraception: none Last pap: 2013, negative Sexual history: intercourse with one partner only. Is trying to conceive.  Immunizations: needs flu Dentist: never Eye: 20/13 b/l eyes Diet/Exercise: does not exercise.  Tobacco/alcohol/substance use:  reports that she has never smoked. She has never used smokeless tobacco. She reports that she does not drink alcohol or use drugs.  Review of Systems:  Review of Systems  Constitutional: Negative for chills, diaphoresis, fatigue and fever.  HENT: Negative for congestion, postnasal drip, rhinorrhea, sinus pressure, sinus pain, sneezing and sore throat.   Respiratory: Negative for cough, chest tightness, shortness of breath and wheezing.   Cardiovascular: Negative for chest pain and palpitations.  Gastrointestinal: Negative for abdominal pain, diarrhea, nausea and vomiting.  Musculoskeletal: Positive for back pain (right side).  Neurological: Negative for weakness, light-headedness and headaches.  Psychiatric/Behavioral: Negative for sleep disturbance.    Patient Active Problem List   Diagnosis Date Noted  . Language barrier 01/03/2013    Prior to Admission medications   Medication Sig Start Date End Date Taking? Authorizing Provider  cyclobenzaprine (FLEXERIL) 10 MG tablet Take 1  tablet (10 mg total) by mouth 3 (three) times daily as needed for muscle spasms. Patient not taking: Reported on 09/03/2018 07/21/18   Anesia Blackwell, Madelaine Bhat, PA-C  ibuprofen (ADVIL,MOTRIN) 200 MG tablet Take 200 mg by mouth every 6 (six) hours as needed.    [provider]  meloxicam (MOBIC) 7.5 MG tablet Take 1-2 tablets (7.5-15 mg total) by mouth daily. Patient not taking: Reported on 09/03/2018 07/21/18   Ashok Sawaya, Madelaine Bhat, PA-C  triamcinolone cream (KENALOG) 0.1 % Apply 1 application topically 2 (two) times daily. Patient not taking: Reported on 09/03/2018 07/21/18   Kameren Baade, Madelaine Bhat, PA-C    No Known Allergies  History reviewed. No pertinent surgical history.  Social History   Tobacco Use  . Smoking status: Never Smoker  . Smokeless tobacco: Never Used  Substance Use Topics  . Alcohol use: No    Alcohol/week: 0.0 standard drinks  . Drug use: No    Family History  Problem Relation Age of Onset  . Hyperlipidemia Mother   . Hypertension Mother   . Hypertension Father   . Hyperlipidemia Father     Medication list has been reviewed and updated.  Physical Examination:  Physical Exam  Constitutional: She is oriented to person, place, and time. She appears well-developed and well-nourished. No distress.  HENT:  Head: Normocephalic and atraumatic.  Mouth/Throat: Oropharynx is clear and moist.  Eyes: Pupils are equal, round, and reactive to light. Conjunctivae and EOM are normal.  Neck: Normal range of motion. Neck supple. No thyromegaly present.  Cardiovascular: Normal rate, regular rhythm and normal heart sounds.  No murmur heard. Pulmonary/Chest: Effort normal  and breath sounds normal. She has no wheezes. Right breast exhibits no mass. Left breast exhibits no mass.  Abdominal: Soft. She exhibits no mass. There is no tenderness.  Genitourinary: Vagina normal. Cervix exhibits no motion tenderness and no discharge. Right adnexum displays no mass and no  tenderness. Left adnexum displays no mass and no tenderness.  Genitourinary Comments: Cervical polyp  Musculoskeletal: Normal range of motion.  Neurological: She is alert and oriented to person, place, and time. She has normal reflexes.  Skin: Skin is warm and dry.  Psychiatric: She has a normal mood and affect. Her behavior is normal. Judgment and thought content normal.  Vitals reviewed.   BP 131/88   Pulse 70   Temp 97.9 F (36.6 C) (Oral)   Resp 18   Ht 5' 0.04" (1.525 m)   Wt 148 lb 3.2 oz (67.2 kg)   LMP 08/07/2018 (Exact Date)   SpO2 98%   BMI 28.91 kg/m  Results for orders placed or performed in visit on 09/03/18  POCT urinalysis dipstick  Result Value Ref Range   Color, UA yellow yellow   Clarity, UA clear clear   Glucose, UA negative negative mg/dL   Bilirubin, UA negative negative   Ketones, POC UA negative negative mg/dL   Spec Grav, UA 1.610 9.604 - 1.025   Blood, UA negative negative   pH, UA 7.5 5.0 - 8.0   Protein Ur, POC negative negative mg/dL   Urobilinogen, UA 0.2 0.2 or 1.0 E.U./dL   Nitrite, UA Negative Negative   Leukocytes, UA Negative Negative  POCT Wet + KOH Prep  Result Value Ref Range   Yeast by KOH Absent Absent   Yeast by wet prep Absent Absent   WBC by wet prep Moderate (A) Few   Clue Cells Wet Prep HPF POC Few (A) None   Trich by wet prep Absent Absent   Bacteria Wet Prep HPF POC Few Few   Epithelial Cells By [employer] Pref (UMFC) Moderate (A) None, Few, Too numerous to count   RBC,UR,HPF,POC None None RBC/hpf    Assessment and Plan: 1. Annual physical exam - Pt here for annual exam. PAP done today. Routine labs are pending. Advised pt go to [facility] for eval. Anticipatory guidance provided.   2. Screening for cervical cancer - Pap IG, CT/NG NAA, and HPV (high risk)  3. Screening for endocrine, nutritional, metabolic and immunity disorder - CBC with Differential/Platelet - Lipid panel - Hemoglobin A1c - VITAMIN D 25  Hydroxy (Vit-D Deficiency, Fractures) - POCT urinalysis dipstick  4. Screening for HIV (human immunodeficiency virus) - HIV Antibody (routine testing w rflx)  5. Cough - HYDROcodone-homatropine (HYCODAN) 5-1.5 MG/5ML syrup; Take 5 mLs by mouth every 8 (eight) hours as needed for cough.  Dispense: 120 mL; Refill: 0  6. Vaginal discharge - POCT Wet + KOH Prep  Marco Collie, PA-C  Primary Care at [facility] 09/03/2018 10:57 AM

## 2018-09-04 LAB — CBC WITH DIFFERENTIAL/PLATELET
Basophils Absolute: 0.1 10*3/uL (ref 0.0–0.2)
Basos: 1 %
EOS (ABSOLUTE): 0.4 10*3/uL (ref 0.0–0.4)
Eos: 5 %
Hematocrit: 41 % (ref 34.0–46.6)
Hemoglobin: 13.3 g/dL (ref 11.1–15.9)
Immature Grans (Abs): 0 10*3/uL (ref 0.0–0.1)
Immature Granulocytes: 0 %
Lymphocytes Absolute: 2.4 10*3/uL (ref 0.7–3.1)
Lymphs: 31 %
MCH: 29.2 pg (ref 26.6–33.0)
MCHC: 32.4 g/dL (ref 31.5–35.7)
MCV: 90 fL (ref 79–97)
Monocytes Absolute: 0.6 10*3/uL (ref 0.1–0.9)
Monocytes: 7 %
Neutrophils Absolute: 4.4 10*3/uL (ref 1.4–7.0)
Neutrophils: 56 %
Platelets: 270 10*3/uL (ref 150–450)
RBC: 4.55 x10E6/uL (ref 3.77–5.28)
RDW: 12.7 % (ref 12.3–15.4)
WBC: 7.9 10*3/uL (ref 3.4–10.8)

## 2018-09-04 LAB — HEMOGLOBIN A1C
Est. average glucose Bld gHb Est-mCnc: 114 mg/dL
Hgb A1c MFr Bld: 5.6 % (ref 4.8–5.6)

## 2018-09-04 LAB — LIPID PANEL
Chol/HDL Ratio: 5.7 ratio — ABNORMAL HIGH (ref 0.0–4.4)
Cholesterol, Total: 222 mg/dL — ABNORMAL HIGH (ref 100–199)
HDL: 39 mg/dL — ABNORMAL LOW (ref >39)
LDL Calculated: 140 mg/dL — ABNORMAL HIGH (ref 0–99)
Triglycerides: 215 mg/dL — ABNORMAL HIGH (ref 0–149)
VLDL Cholesterol Cal: 43 mg/dL — ABNORMAL HIGH (ref 5–40)

## 2018-09-04 LAB — VITAMIN D 25 HYDROXY (VIT D DEFICIENCY, FRACTURES): Vit D, 25-Hydroxy: 11.1 ng/mL — ABNORMAL LOW (ref 30.0–100.0)

## 2018-09-04 LAB — HIV ANTIBODY (ROUTINE TESTING W REFLEX): HIV Screen 4th Generation wRfx: NONREACTIVE

## 2018-09-07 LAB — PAP IG, CT-NG NAA, HPV HIGH-RISK
Chlamydia, Nuc. Acid Amp: NEGATIVE
Gonococcus by Nucleic Acid Amp: NEGATIVE
HPV, high-risk: NEGATIVE
PAP Smear Comment: 0

## 2018-09-08 ENCOUNTER — Encounter: Payer: Self-pay | Admitting: Physician Assistant

## 2018-09-08 NOTE — Progress Notes (Signed)
Please call pt: (need to speak with husband, pt speaks [place]  SCHEDULING: please make appt with me to discuss cholesterol and vitamin D levels from her annual exam.   LAB: Your labs came back and it looks like your cholesterol levels are elevated and your vitamin D level is low.  Please come back to discuss the lab results and treatment plan. We need to start medications.  The rest of your labs look great! - your blood levels, kidney and liver function, salts in your blood are perfect.  You do not have diabetes or HIV.  Your cervical cancer screening test is negative for abnormal results. So your next PAP should be done in 3 years.

## 2018-09-23 ENCOUNTER — Telehealth: Payer: Self-pay | Admitting: Physician Assistant

## 2018-09-23 NOTE — Telephone Encounter (Signed)
Left a VM in regards to setting up her an appt with Whitney McVey to discuss lab results.

## 2018-10-26 ENCOUNTER — Encounter: Payer: Self-pay | Admitting: Emergency Medicine

## 2018-10-26 ENCOUNTER — Ambulatory Visit (INDEPENDENT_AMBULATORY_CARE_PROVIDER_SITE_OTHER): Payer: BLUE CROSS/BLUE SHIELD | Admitting: Emergency Medicine

## 2018-10-26 ENCOUNTER — Other Ambulatory Visit: Payer: Self-pay

## 2018-10-26 VITALS — BP 124/83 | HR 88 | Temp 98.2°F | Resp 16 | Ht 60.0 in | Wt 145.8 lb

## 2018-10-26 DIAGNOSIS — Z23 Encounter for immunization: Secondary | ICD-10-CM | POA: Diagnosis not present

## 2018-10-26 DIAGNOSIS — R109 Unspecified abdominal pain: Secondary | ICD-10-CM

## 2018-10-26 LAB — COMPREHENSIVE METABOLIC PANEL
A/G RATIO: 1.7 (ref 1.2–2.2)
ALK PHOS: 55 IU/L (ref 39–117)
ALT: 12 IU/L (ref 0–32)
AST: 12 IU/L (ref 0–40)
Albumin: 4.7 g/dL (ref 3.5–5.5)
BUN/Creatinine Ratio: 11 (ref 9–23)
BUN: 8 mg/dL (ref 6–24)
Bilirubin Total: 0.2 mg/dL (ref 0.0–1.2)
CALCIUM: 10.1 mg/dL (ref 8.7–10.2)
CHLORIDE: 100 mmol/L (ref 96–106)
CO2: 23 mmol/L (ref 20–29)
Creatinine, Ser: 0.71 mg/dL (ref 0.57–1.00)
GFR calc Af Amer: 121 mL/min/{1.73_m2} (ref >59)
GFR calc non Af Amer: 105 mL/min/{1.73_m2} (ref >59)
GLOBULIN, TOTAL: 2.8 g/dL (ref 1.5–4.5)
Glucose: 97 mg/dL (ref 65–99)
POTASSIUM: 4.4 mmol/L (ref 3.5–5.2)
SODIUM: 139 mmol/L (ref 134–144)
Total Protein: 7.5 g/dL (ref 6.0–8.5)

## 2018-10-26 LAB — CBC WITH DIFFERENTIAL/PLATELET
BASOS: 1 %
Basophils Absolute: 0.1 10*3/uL (ref 0.0–0.2)
EOS (ABSOLUTE): 0.3 10*3/uL (ref 0.0–0.4)
Eos: 4 %
Hematocrit: 41.3 % (ref 34.0–46.6)
Hemoglobin: 14.4 g/dL (ref 11.1–15.9)
IMMATURE GRANULOCYTES: 0 %
Immature Grans (Abs): 0 10*3/uL (ref 0.0–0.1)
Lymphocytes Absolute: 2.6 10*3/uL (ref 0.7–3.1)
Lymphs: 35 %
MCH: 30.3 pg (ref 26.6–33.0)
MCHC: 34.9 g/dL (ref 31.5–35.7)
MCV: 87 fL (ref 79–97)
MONOS ABS: 0.5 10*3/uL (ref 0.1–0.9)
Monocytes: 7 %
NEUTROS PCT: 53 %
Neutrophils Absolute: 3.8 10*3/uL (ref 1.4–7.0)
PLATELETS: 322 10*3/uL (ref 150–450)
RBC: 4.75 x10E6/uL (ref 3.77–5.28)
RDW: 12.3 % (ref 12.3–15.4)
WBC: 7.3 10*3/uL (ref 3.4–10.8)

## 2018-10-26 LAB — POCT URINALYSIS DIP (MANUAL ENTRY)
BILIRUBIN UA: NEGATIVE
Glucose, UA: NEGATIVE mg/dL
Ketones, POC UA: NEGATIVE mg/dL
NITRITE UA: NEGATIVE
PH UA: 5.5 (ref 5.0–8.0)
Protein Ur, POC: NEGATIVE mg/dL
RBC UA: NEGATIVE
SPEC GRAV UA: 1.015 (ref 1.010–1.025)
UROBILINOGEN UA: 0.2 U/dL

## 2018-10-26 LAB — HEMOGLOBIN A1C
ESTIMATED AVERAGE GLUCOSE: 114 mg/dL
Hgb A1c MFr Bld: 5.6 % (ref 4.8–5.6)

## 2018-10-26 NOTE — Patient Instructions (Addendum)
     If you have lab work done today you will be contacted with your lab results within the next 2 weeks.  If you have not heard from us then please contact us. The fastest way to get your results is to register for My Chart.   IF you received an x-ray today, you will receive an invoice [facility]. Please contact [facility] at [phone] with questions or concerns regarding your invoice.   IF you received labwork today, you will receive an invoice from [facility]. Please contact [facility] at [phone] with questions or concerns regarding your invoice.   Our billing staff will not be able to assist you with questions regarding bills from these companies.  You will be contacted with the lab results as soon as they are available. The fastest way to get your results is to activate your My Chart account. Instructions are located on the last page of this paperwork. If you have not heard from us regarding the results in 2 weeks, please contact this office.     Flank Pain Flank pain is pain in your side. The flank is the area of your side between your upper belly (abdomen) and your back. The pain may occur over a short period of time (acute) or may be long-term or come back often (chronic). It may be mild or very bad. Pain in this area can be caused by many different things. Follow these instructions at home:  Rest as told by your doctor.  Drink enough fluid to keep your pee (urine) clear or pale yellow.  Take over-the-counter and prescription medicines only as told by your doctor.  Keep all follow-up visits as told by your doctor. This is important. Contact a doctor if:  Medicine does not help your pain.  You have new symptoms.  Your pain gets worse.  You have a fever.  Your symptoms last longer than 2-3 days. Get help right away if:  Your tummy hurts or is swollen.  You are short of breath.  You feel sick to your stomach (nauseous) and it does not go  away.  You cannot stop throwing up (vomiting).  You feel like you will pass out or you do pass out (faint).  You have blood in your pee.  You have a fever and your symptoms suddenly get worse. This information is not intended to replace advice given to you by your health care provider. Make sure you discuss any questions you have with your health care provider. Document Released: 08/27/2008 Document Revised: 08/09/2016 Document Reviewed: 08/22/2015 Elsevier Interactive Patient Education  2018 [employer]

## 2018-10-26 NOTE — Progress Notes (Signed)
Pamela Stanley 42 y.o.   Chief Complaint  Patient presents with  . Flank Pain    right side of back area. pain come and go x1 month now    HISTORY OF PRESENT ILLNESS: This is a 42 y.o. female complaining of pain to the right flank area on and off for the past 3 to 4 months.  Intermittent sharp pain sometimes lasting for couple of days with no associated symptoms.  Denies injury.  Denies urinary symptoms.  Eating and drinking well denies nausea or vomiting.  HPI   Prior to Admission medications   Not on File    No Known Allergies  Patient Active Problem List   Diagnosis Date Noted  . Language barrier 01/03/2013    Past Medical History:  Diagnosis Date  . GERD (gastroesophageal reflux disease)   . Ulcer     No past surgical history on file.  Social History   Socioeconomic History  . Marital status: Married    Spouse name: Not on file  . Number of children: 0  . Years of education: Not on file  . Highest education level: Not on file  Occupational History  . Not on file  Social Needs  . Financial resource strain: Not on file  . Food insecurity:    Worry: Not on file    Inability: Not on file  . Transportation needs:    Medical: Not on file    Non-medical: Not on file  Tobacco Use  . Smoking status: Never Smoker  . Smokeless tobacco: Never Used  Substance and Sexual Activity  . Alcohol use: No    Alcohol/week: 0.0 standard drinks  . Drug use: No  . Sexual activity: Yes    Birth control/protection: None  Lifestyle  . Physical activity:    Days per week: Not on file    Minutes per session: Not on file  . Stress: Not on file  Relationships  . Social connections:    Talks on phone: Not on file    Gets together: Not on file    Attends religious service: Not on file    Active member of club or organization: Not on file    Attends meetings of clubs or organizations: Not on file    Relationship status: Not on file  . Intimate partner violence:    Fear of  current or ex partner: Not on file    Emotionally abused: Not on file    Physically abused: Not on file    Forced sexual activity: Not on file  Other Topics Concern  . Not on file  Social History Narrative  . Not on file    Family History  Problem Relation Age of Onset  . Hyperlipidemia Mother   . Hypertension Mother   . Hypertension Father   . Hyperlipidemia Father      Review of Systems  Constitutional: Negative.  Negative for chills and fever.  HENT: Negative.   Eyes: Negative.   Respiratory: Negative.  Negative for cough and shortness of breath.   Cardiovascular: Negative.  Negative for chest pain and palpitations.  Gastrointestinal: Negative.  Negative for abdominal pain, blood in stool, melena, nausea and vomiting.  Genitourinary: Positive for flank pain.  Skin: Negative.  Negative for rash.  Neurological: Negative.  Negative for dizziness and headaches.  Endo/Heme/Allergies: Negative.   All other systems reviewed and are negative.  Vitals:   10/26/18 1022  BP: 124/83  Pulse: 88  Resp: 16  Temp: 98.2 F (  36.8 C)  SpO2: 98%    Physical Exam  Constitutional: She is oriented to person, place, and time. She appears well-developed and well-nourished.  HENT:  Head: Normocephalic and atraumatic.  Nose: Nose normal.  Mouth/Throat: Oropharynx is clear and moist.  Eyes: Pupils are equal, round, and reactive to light. Conjunctivae and EOM are normal.  Neck: Normal range of motion. Neck supple.  Cardiovascular: Normal rate, regular rhythm and normal heart sounds.  Pulmonary/Chest: Effort normal and breath sounds normal.  Abdominal: Soft. Bowel sounds are normal. She exhibits no distension and no mass. There is no tenderness. There is no guarding. No hernia.  Musculoskeletal: Normal range of motion. She exhibits no edema or tenderness.  Neurological: She is alert and oriented to person, place, and time. No sensory deficit. She exhibits normal muscle tone.  Skin: Skin  is warm and dry. Capillary refill takes less than 2 seconds.  Psychiatric: She has a normal mood and affect. Her behavior is normal.  Vitals reviewed.  Results for orders placed or performed in visit on 10/26/18 (from the past 24 hour(s))  POCT urinalysis dipstick     Status: Abnormal   Collection Time: 10/26/18 11:20 AM  Result Value Ref Range   Color, UA yellow yellow   Clarity, UA clear clear   Glucose, UA negative negative mg/dL   Bilirubin, UA negative negative   Ketones, POC UA negative negative mg/dL   Spec Grav, UA [id number] - 1.025   Blood, UA negative negative   pH, UA 5.5 5.0 - 8.0   Protein Ur, POC negative negative mg/dL   Urobilinogen, UA 0.2 0.2 or 1.0 E.U./dL   Nitrite, UA Negative Negative   Leukocytes, UA Small (1+) (A) Negative   A total of 25 minutes was spent in the room with the patient, greater than 50% of which was in counseling/coordination of care regarding differential diagnosis, treatment, need for diagnostic work-up, and need for follow-up.   ASSESSMENT & PLAN: [name] was seen today for flank pain.  Diagnoses and all orders for this visit:  Flank pain -     POCT urinalysis dipstick -     CBC with Differential/Platelet -     Comprehensive metabolic panel -     Hemoglobin A1c -     US Renal; Future  Other orders -     Flu Vaccine QUAD 6+ mos PF IM (Fluarix Quad PF)   Patient Instructions       If you have lab work done today you will be contacted with your lab results within the next 2 weeks.  If you have not heard from us then please contact us. The fastest way to get your results is to register for My Chart.   IF you received an x-ray today, you will receive an invoice from [facility]. Please contact [facility] at [phone] with questions or concerns regarding your invoice.   IF you received labwork today, you will receive an invoice from [facility]. Please contact [facility] at [phone] with questions or concerns  regarding your invoice.   Our billing staff will not be able to assist you with questions regarding bills from these companies.  You will be contacted with the lab results as soon as they are available. The fastest way to get your results is to activate your My Chart account. Instructions are located on the last page of this paperwork. If you have not heard from us regarding the results in 2 weeks, please contact this office.  Flank Pain Flank pain is pain in your side. The flank is the area of your side between your upper belly (abdomen) and your back. The pain may occur over a short period of time (acute) or may be long-term or come back often (chronic). It may be mild or very bad. Pain in this area can be caused by many different things. Follow these instructions at home:  Rest as told by your doctor.  Drink enough fluid to keep your pee (urine) clear or pale yellow.  Take over-the-counter and prescription medicines only as told by your doctor.  Keep all follow-up visits as told by your doctor. This is important. Contact a doctor if:  Medicine does not help your pain.  You have new symptoms.  Your pain gets worse.  You have a fever.  Your symptoms last longer than 2-3 days. Get help right away if:  Your tummy hurts or is swollen.  You are short of breath.  You feel sick to your stomach (nauseous) and it does not go away.  You cannot stop throwing up (vomiting).  You feel like you will pass out or you do pass out (faint).  You have blood in your pee.  You have a fever and your symptoms suddenly get worse. This information is not intended to replace advice given to you by your health care provider. Make sure you discuss any questions you have with your health care provider. Document Released: 08/27/2008 Document Revised: 08/09/2016 Document Reviewed: 08/22/2015 Elsevier Interactive Patient Education  2018 Elsevier Inc.      Edwina Barth, MD [facility]

## 2018-10-27 ENCOUNTER — Encounter: Payer: Self-pay | Admitting: Radiology

## 2018-10-27 LAB — URINE CULTURE

## 2018-11-04 ENCOUNTER — Encounter: Payer: Self-pay | Admitting: Emergency Medicine

## 2018-12-16 ENCOUNTER — Ambulatory Visit (HOSPITAL_COMMUNITY)
Admission: EM | Admit: 2018-12-16 | Discharge: 2018-12-16 | Disposition: A | Discharge status: Home / Self Care | Payer: BC Managed Care – PPO | Attending: Family Medicine | Admitting: Family Medicine

## 2018-12-16 ENCOUNTER — Encounter (HOSPITAL_COMMUNITY): Payer: Self-pay | Admitting: Emergency Medicine

## 2018-12-16 DIAGNOSIS — J029 Acute pharyngitis, unspecified: Secondary | ICD-10-CM | POA: Insufficient documentation

## 2018-12-16 DIAGNOSIS — L308 Other specified dermatitis: Secondary | ICD-10-CM | POA: Diagnosis not present

## 2018-12-16 LAB — POCT RAPID STREP A: STREPTOCOCCUS, GROUP A SCREEN (DIRECT): NEGATIVE

## 2018-12-16 MED ORDER — CETIRIZINE HCL 10 MG PO CAPS: 10.0000 mg | ORAL_CAPSULE | Freq: Every day | ORAL | 0 refills | Status: DC

## 2018-12-16 MED ORDER — MOMETASONE FUROATE 0.1 % EX CREA: 1.0000 "application " | TOPICAL_CREAM | Freq: Every day | CUTANEOUS | 0 refills | Status: DC

## 2018-12-16 NOTE — Discharge Instructions (Signed)

## 2018-12-16 NOTE — ED Provider Notes (Signed)
[facility]    CSN: [id number] Arrival date & time: 12/16/18  1740     History   Chief Complaint Chief Complaint  Patient presents with  . URI    HPI Pamela Stanley is a 43 y.o. female no significant past medical history presenting today for evaluation of sore throat and fever.  Patient has had sore throat for the past couple of days.  She has also had associated subjective fevers, feeling warm.  Has had cough.  Denies nasal congestion or ear pain.  Has tried NyQuil and Mucinex without relief.  Denies chest pain or shortness of breath.  HPI  Past Medical History:  Diagnosis Date  . GERD (gastroesophageal reflux disease)   . Ulcer     Patient Active Problem List   Diagnosis Date Noted  . Flank pain 10/26/2018  . Language barrier 01/03/2013    History reviewed. No pertinent surgical history.  OB History   No obstetric history on file.      Home Medications    Prior to Admission medications   Medication Sig Start Date End Date Taking? Authorizing Provider  Cetirizine HCl 10 MG CAPS Take 1 capsule (10 mg total) by mouth daily for 10 days. 12/16/18 12/26/18  Wieters, Hallie C, PA-C  mometasone (ELOCON) 0.1 % cream Apply 1 application topically daily. 12/16/18   Wieters, Junius CreamerHallie C, PA-C    Family History Family History  Problem Relation Age of Onset  . Hyperlipidemia Mother   . Hypertension Mother   . Hypertension Father   . Hyperlipidemia Father     Social History Social History   Tobacco Use  . Smoking status: Never Smoker  . Smokeless tobacco: Never Used  Substance Use Topics  . Alcohol use: No    Alcohol/week: 0.0 standard drinks  . Drug use: No     Allergies   Patient has no known allergies.   Review of Systems Review of Systems  Constitutional: Negative for activity change, appetite change, chills, fatigue and fever.  HENT: Positive for congestion and sore throat. Negative for ear pain, rhinorrhea, sinus pressure and trouble  swallowing.   Eyes: Negative for discharge and redness.  Respiratory: Positive for cough. Negative for chest tightness and shortness of breath.   Cardiovascular: Negative for chest pain.  Gastrointestinal: Negative for abdominal pain, diarrhea, nausea and vomiting.  Musculoskeletal: Negative for myalgias.  Skin: Negative for rash.  Neurological: Negative for dizziness, light-headedness and headaches.     Physical Exam Triage Vital Signs ED Triage Vitals  Enc Vitals Group     BP 12/16/18 1805 (!) 132/94     Pulse Rate 12/16/18 1805 95     Resp 12/16/18 1805 18     Temp 12/16/18 1805 (!) 97.5 F (36.4 C)     Temp Source 12/16/18 1805 Temporal     SpO2 12/16/18 1805 97 %     Weight --      Height --      Head Circumference --      Peak Flow --      Pain Score 12/16/18 1806 6     Pain Loc --      Pain Edu? --      Excl. in GC? --    No data found.  Updated Vital Signs BP (!) 132/94 (BP Location: Right Arm)   Pulse 95   Temp (!) 97.5 F (36.4 C) (Temporal)   Resp 18   SpO2 97%   Visual Acuity Right Eye Distance:  Left Eye Distance:   Bilateral Distance:    Right Eye Near:   Left Eye Near:    Bilateral Near:     Physical Exam Vitals signs and nursing note reviewed.  Constitutional:      General: She is not in acute distress.    Appearance: She is well-developed.  HENT:     Head: Normocephalic and atraumatic.     Ears:     Comments: Bilateral ears without tenderness to palpation of external auricle, tragus and mastoid, EAC's without erythema or swelling, TM's with good bony landmarks and cone of light. Non erythematous.    Nose:     Comments: Nasal mucosa pink, nonerythematous    Mouth/Throat:     Comments: Oral mucosa pink and moist, no tonsillar enlargement or exudate. Posterior pharynx patent and erythematous, no uvula deviation or swelling. Normal phonation. Eyes:     Conjunctiva/sclera: Conjunctivae normal.  Neck:     Musculoskeletal: Neck supple.    Cardiovascular:     Rate and Rhythm: Normal rate and regular rhythm.     Heart sounds: No murmur.  Pulmonary:     Effort: Pulmonary effort is normal. No respiratory distress.     Breath sounds: Normal breath sounds.     Comments: Breathing comfortably at rest, CTABL, no wheezing, rales or other adventitious sounds auscultated Abdominal:     Palpations: Abdomen is soft.     Tenderness: There is no abdominal tenderness.  Skin:    General: Skin is warm and dry.  Neurological:     Mental Status: She is alert.      UC Treatments / Results  Labs (all labs ordered are listed, but only abnormal results are displayed) Labs Reviewed  CULTURE, GROUP A STREP [facility]  POCT RAPID STREP A    EKG None  Radiology No results found.  Procedures Procedures (including critical care time)  Medications Ordered in UC Medications - No data to display  Initial Impression / Assessment and Plan / UC Course  I have reviewed the triage vital signs and the nursing notes.  Pertinent labs & imaging results that were available during my care of the patient were reviewed by me and considered in my medical decision making (see chart for details).    Strep test negative.  URI symptoms x2 days.  Recommending symptomatic and supportive care, strep test negative, most likely viral etiology.  Recommendations below.  Continue to monitor,Discussed strict return precautions. Patient verbalized understanding and is agreeable with plan.   Final Clinical Impressions(s) / UC Diagnoses   Final diagnoses:  Sore throat  Other eczema     Discharge Instructions     Sore Throat  Your rapid strep tested Negative today. We will send for a culture and call in about 2 days if results are positive. For now we will treat your sore throat as a virus with symptom management.   Please continue Tylenol or Ibuprofen for fever and pain. May try salt water gargles, cepacol lozenges, throat spray, or OTC cold relief  medicine for throat discomfort. If you also have congestion take a daily anti-histamine like Zyrtec, Claritin, and a oral decongestant to help with post nasal drip that may be irritating your throat.   Stay hydrated and drink plenty of fluids to keep your throat coated relieve irritation.      ED Prescriptions    Medication Sig Dispense Auth. Provider   Cetirizine HCl 10 MG CAPS Take 1 capsule (10 mg total) by mouth daily for  10 days. 10 capsule Wieters, Hallie C, PA-C   mometasone (ELOCON) 0.1 % cream Apply 1 application topically daily. 45 g Wieters, Harrold C, PA-C     Controlled Substance Prescriptions [place] Controlled Substance Registry consulted? Not Applicable   Lew Dawes, [place] 12/16/18 1847

## 2018-12-16 NOTE — ED Triage Notes (Signed)
Pt here for URI sx and fever

## 2018-12-19 LAB — CULTURE, GROUP A STREP (THRC)

## 2019-01-11 ENCOUNTER — Encounter: Payer: Self-pay | Admitting: Emergency Medicine

## 2019-05-11 ENCOUNTER — Ambulatory Visit: Payer: Self-pay | Admitting: *Deleted

## 2019-05-11 ENCOUNTER — Emergency Department (HOSPITAL_COMMUNITY)
Admission: EM | Admit: 2019-05-11 | Discharge: 2019-05-12 | Disposition: A | Discharge status: Home / Self Care | Payer: BC Managed Care – PPO | Attending: Emergency Medicine | Admitting: Emergency Medicine

## 2019-05-11 ENCOUNTER — Emergency Department (HOSPITAL_COMMUNITY): Payer: BC Managed Care – PPO

## 2019-05-11 ENCOUNTER — Other Ambulatory Visit: Payer: Self-pay

## 2019-05-11 ENCOUNTER — Encounter (HOSPITAL_COMMUNITY): Payer: Self-pay

## 2019-05-11 DIAGNOSIS — R112 Nausea with vomiting, unspecified: Secondary | ICD-10-CM | POA: Insufficient documentation

## 2019-05-11 DIAGNOSIS — K8020 Calculus of gallbladder without cholecystitis without obstruction: Secondary | ICD-10-CM | POA: Diagnosis not present

## 2019-05-11 DIAGNOSIS — R1013 Epigastric pain: Secondary | ICD-10-CM | POA: Insufficient documentation

## 2019-05-11 LAB — URINALYSIS, ROUTINE W REFLEX MICROSCOPIC
Bilirubin Urine: NEGATIVE
Glucose, UA: NEGATIVE mg/dL
Hgb urine dipstick: NEGATIVE
Ketones, ur: 5 mg/dL — AB
Nitrite: NEGATIVE
Protein, ur: 100 mg/dL — AB
Specific Gravity, Urine: 1.015 (ref 1.005–1.030)
pH: 9 — ABNORMAL HIGH (ref 5.0–8.0)

## 2019-05-11 LAB — COMPREHENSIVE METABOLIC PANEL
ALT: 19 U/L (ref 0–44)
AST: 19 U/L (ref 15–41)
Albumin: 4.3 g/dL (ref 3.5–5.0)
Alkaline Phosphatase: 43 U/L (ref 38–126)
Anion gap: 11 (ref 5–15)
BUN: 7 mg/dL (ref 6–20)
CO2: 25 mmol/L (ref 22–32)
Calcium: 9.7 mg/dL (ref 8.9–10.3)
Chloride: 103 mmol/L (ref 98–111)
Creatinine, Ser: 0.55 mg/dL (ref 0.44–1.00)
GFR calc Af Amer: >60 mL/min (ref >60)
GFR calc non Af Amer: >60 mL/min (ref >60)
Glucose, Bld: 136 mg/dL — ABNORMAL HIGH (ref 70–99)
Potassium: 3.5 mmol/L (ref 3.5–5.1)
Sodium: 139 mmol/L (ref 135–145)
Total Bilirubin: 0.3 mg/dL (ref 0.3–1.2)
Total Protein: 7.8 g/dL (ref 6.5–8.1)

## 2019-05-11 LAB — CBC
HCT: 39.7 % (ref 36.0–46.0)
Hemoglobin: 13.4 g/dL (ref 12.0–15.0)
MCH: 29.9 pg (ref 26.0–34.0)
MCHC: 33.8 g/dL (ref 30.0–36.0)
MCV: 88.6 fL (ref 80.0–100.0)
Platelets: 265 10*3/uL (ref 150–400)
RBC: 4.48 MIL/uL (ref 3.87–5.11)
RDW: 11.9 % (ref 11.5–15.5)
WBC: 12.2 10*3/uL — ABNORMAL HIGH (ref 4.0–10.5)
nRBC: 0 % (ref 0.0–0.2)

## 2019-05-11 LAB — LIPASE, BLOOD: Lipase: 36 U/L (ref 11–51)

## 2019-05-11 LAB — I-STAT BETA HCG BLOOD, ED (MC, WL, AP ONLY): I-stat hCG, quantitative: <5 m[IU]/mL (ref <5)

## 2019-05-11 MED ORDER — SODIUM CHLORIDE 0.9% FLUSH: 3.0000 mL | Freq: Once | INTRAVENOUS | Status: DC

## 2019-05-11 MED ORDER — ONDANSETRON HCL 4 MG/2ML IJ SOLN
4.0000 mg | Freq: Once | INTRAMUSCULAR | Status: AC
  Administered 2019-05-12: 4 mg via INTRAVENOUS
  Filled 2019-05-11: qty 2

## 2019-05-11 MED ORDER — SODIUM CHLORIDE 0.9 % IV BOLUS
1000.0000 mL | Freq: Once | INTRAVENOUS | Status: AC
  Administered 2019-05-12: 1000 mL via INTRAVENOUS

## 2019-05-11 NOTE — ED Triage Notes (Signed)
Pt c.o generalized abd pain and n/v for the past couple days. Pt a.o, nad noted at this time.

## 2019-05-11 NOTE — Telephone Encounter (Signed)
Pt's husband calling with his wife having abd pain for the last 2 days. Stated that it was worst last night. She vomited once last night. Denies fever, diarrhea or constipation. She is sleeping right now. Thinks is may be undercook food that she had eaten. No other symptoms. Advised that she may possibly be seen in the ED if this continues. He voiced understanding. He is requesting an appointment for her. Notified PCP regarding an appointment. Call transferred to the practice.  Reason for Disposition . [1] MODERATE pain (e.g., interferes with normal activities) AND [2] pain comes and goes (cramps) AND [3] present > 24 hours  (Exception: pain with Vomiting or Diarrhea - see that Guideline)  Answer Assessment - Initial Assessment Questions 1. LOCATION: "Where does it hurt?"      Middle abd 2. RADIATION: "Does the pain shoot anywhere else?" (e.g., chest, back)     no 3. ONSET: "When did the pain begin?" (e.g., minutes, hours or days ago)      2 days ago 4. SUDDEN: "Gradual or sudden onset?"     gradual 5. PATTERN "Does the pain come and go, or is it constant?"    - If constant: "Is it getting better, staying the same, or worsening?"      (Note: Constant means the pain never goes away completely; most serious pain is constant and it progresses)     - If intermittent: "How long does it last?" "Do you have pain now?"     (Note: Intermittent means the pain goes away completely between bouts)     Comes and goes, got worst last night 6. SEVERITY: "How bad is the pain?"  (e.g., Scale 1-10; mild, moderate, or severe)   - MILD (1-3): doesn't interfere with normal activities, abdomen soft and not tender to touch    - MODERATE (4-7): interferes with normal activities or awakens from sleep, tender to touch    - SEVERE (8-10): excruciating pain, doubled over, unable to do any normal activities      6 or 7 last night 7. RECURRENT SYMPTOM: "Have you ever had this type of abdominal pain before?" If so,  ask: "When was the last time?" and "What happened that time?"      no 8. CAUSE: "What do you think is causing the abdominal pain?"     Maybe uncooked food 9. RELIEVING/AGGRAVATING FACTORS: "What makes it better or worse?" (e.g., movement, antacids, bowel movement)     Feeling pressure when lying down, better when standing up 10. OTHER SYMPTOMS: "Has there been any vomiting, diarrhea, constipation, or urine problems?"       Vomited last night 11. PREGNANCY: "Is there any chance you are pregnant?" "When was your last menstrual period?"       No. LMP 3 weeks ago  Protocols used: ABDOMINAL PAIN - [facility]

## 2019-05-11 NOTE — ED Provider Notes (Signed)
[facility]ENCY [facility] Provider Note   CSN: [id number] Arrival date & time: 05/11/19  1939    History   Chief Complaint Chief Complaint  Patient presents with  . Abdominal Pain  . Nausea    HPI Pamela Stanley is a 43 y.o. female with a PMH of GERD and Ulcers presenting with intermittent epigastric abdominal pain onset 3 days ago. Patient speaks [place]. Interpreter was used to obtain history and physical exam findings. Patient describes pain as sharp and non radiating. Patient states pain is worse with eating fried food. Patient states vomiting alleviates symptoms. Patient reports 4 episodes of non bilious non bloody vomiting in the last 24 hours. Patient reports nausea. Patient denies diarrhea or blood in stool. Last BM was today and it was normal. Patient denies fever, cough, shortness of breath, chest pain, back pain, sick exposures, or recent travel. Patient denies dysuria, hematuria, or urinary frequency. Patient denies alcohol, tobacco, or drug use. LMP on 04/2019.      HPI  Past Medical History:  Diagnosis Date  . GERD (gastroesophageal reflux disease)   . Ulcer     Patient Active Problem List   Diagnosis Date Noted  . Flank pain 10/26/2018  . Language barrier 01/03/2013    History reviewed. No pertinent surgical history.   OB History   No obstetric history on file.      Home Medications    Prior to Admission medications   Medication Sig Start Date End Date Taking? Authorizing Provider  Cetirizine HCl 10 MG CAPS Take 1 capsule (10 mg total) by mouth daily for 10 days. 12/16/18 12/26/18  Wieters, Hallie C, PA-C  mometasone (ELOCON) 0.1 % cream Apply 1 application topically daily. 12/16/18   Wieters, Hallie C, PA-C  ondansetron (ZOFRAN ODT) 4 MG disintegrating tablet Take 1 tablet (4 mg total) by mouth every 8 (eight) hours as needed for nausea or vomiting. 05/12/19   Leretha DykesHernandez, Glorious Flicker P, PA-C    Family History Family History  Problem  Relation Age of Onset  . Hyperlipidemia Mother   . Hypertension Mother   . Hypertension Father   . Hyperlipidemia Father     Social History Social History   Tobacco Use  . Smoking status: Never Smoker  . Smokeless tobacco: Never Used  Substance Use Topics  . Alcohol use: No    Alcohol/week: 0.0 standard drinks  . Drug use: No     Allergies   Patient has no known allergies.   Review of Systems Review of Systems  Constitutional: Negative for activity change, appetite change, chills, fever and unexpected weight change.  HENT: Negative for congestion, rhinorrhea and sore throat.   Eyes: Negative for visual disturbance.  Respiratory: Negative for cough and shortness of breath.   Cardiovascular: Negative for chest pain.  Gastrointestinal: Positive for abdominal pain, nausea and vomiting. Negative for constipation and diarrhea.  Endocrine: Negative for polydipsia, polyphagia and polyuria.  Genitourinary: Negative for dysuria, flank pain and frequency.  Musculoskeletal: Negative for back pain and myalgias.  Skin: Negative for rash and wound.  Allergic/Immunologic: Negative for immunocompromised state.  Neurological: Negative for dizziness, syncope and weakness.  Psychiatric/Behavioral: The patient is not nervous/anxious.     Physical Exam Updated Vital Signs BP 100/88   Pulse 83   Temp 98.2 F (36.8 C) (Oral)   Resp 16   SpO2 99%   Physical Exam Vitals signs and nursing note reviewed.  Constitutional:      General: She is not in  acute distress.    Appearance: She is well-developed. She is not diaphoretic.     Comments: Patient is sitting up in bed in no acute distress.   HENT:     Head: Normocephalic and atraumatic.  Neck:     Musculoskeletal: Normal range of motion and neck supple.  Cardiovascular:     Rate and Rhythm: Normal rate and regular rhythm.     Heart sounds: Normal heart sounds. No murmur. No friction rub. No gallop.   Pulmonary:     Effort:  Pulmonary effort is normal. No respiratory distress.     Breath sounds: Normal breath sounds. No wheezing or rales.  Abdominal:     General: Bowel sounds are normal. There is no distension.     Palpations: Abdomen is soft. Abdomen is not rigid. There is no mass.     Tenderness: There is abdominal tenderness (Mild epigastric and RUQ tenderness to palpation.) in the right upper quadrant and epigastric area. There is no right CVA tenderness, left CVA tenderness, guarding or rebound. Negative signs include Murphy's sign and McBurney's sign.     Hernia: No hernia is present.  Musculoskeletal: Normal range of motion.  Skin:    Findings: No rash.  Neurological:     Mental Status: She is alert and oriented to person, place, and time.    ED Treatments / Results  Labs (all labs ordered are listed, but only abnormal results are displayed) Labs Reviewed  COMPREHENSIVE METABOLIC PANEL - Abnormal; Notable for the following components:      Result Value   Glucose, Bld 136 (*)    All other components within normal limits  CBC - Abnormal; Notable for the following components:   WBC 12.2 (*)    All other components within normal limits  URINALYSIS, ROUTINE W REFLEX MICROSCOPIC - Abnormal; Notable for the following components:   APPearance CLOUDY (*)    pH 9.0 (*)    Ketones, ur 5 (*)    Protein, ur 100 (*)    Leukocytes,Ua TRACE (*)    Bacteria, UA RARE (*)    All other components within normal limits  URINE CULTURE  LIPASE, BLOOD  I-STAT BETA HCG BLOOD, ED ([facility], [facility], [facility] ONLY)    EKG None  Radiology US Abdomen Limited Ruq  Result Date: 05/11/2019 CLINICAL DATA:  Abdominal pain EXAM: ULTRASOUND ABDOMEN LIMITED RIGHT UPPER QUADRANT COMPARISON:  None. FINDINGS: Gallbladder: Multiple gallstones are noted measuring up to approximately 7 mm. There is borderline gallbladder wall thickening, however the gallbladder is not significantly distended. The sonographic Percell Miller sign is reported as negative.  There is no pericholecystic free fluid. Common bile duct: Diameter: 0.2 cm Liver: No focal lesion identified. Within normal limits in parenchymal echogenicity. Portal vein is patent on color Doppler imaging with normal direction of blood flow towards the liver. IMPRESSION: There is cholelithiasis without secondary signs of acute cholecystitis. Electronically Signed   By: Constance Holster M.D.   On: 05/11/2019 23:33    Procedures Procedures (including critical care time)  Medications Ordered in ED Medications  sodium chloride flush (NS) 0.9 % injection 3 mL (3 mLs Intravenous Not Given 05/11/19 2223)  sodium chloride 0.9 % bolus 1,000 mL (0 mLs Intravenous Stopped 05/12/19 0136)  ondansetron (ZOFRAN) injection 4 mg (4 mg Intravenous Given 05/12/19 0025)     Initial Impression / Assessment and Plan / ED Course  I have reviewed the triage vital signs and the nursing notes.  Pertinent labs & imaging results that  were available during my care of the patient were reviewed by me and considered in my medical decision making (see chart for details).  Clinical Course as of May 11 154  Tue May 11, 2019  2304 Leukocytosis noted at 12.2. CBC is otherwise within normal limits.   WBC(!): 12.2 [AH]  2305 CMP reveals hyperglycemia at 136.   Comprehensive metabolic panel(!) [AH]  2305 UA reveals trace leukocytes, rare bacteria, protein, and few ketones. No RBCs, WBCs, or nitrites.  Urinalysis, Routine w reflex microscopic(!) [AH]  2349 There is cholelithiasis without secondary signs of acute cholecystitis.    US Abdomen Limited RUQ [AH]    Clinical Course User Index [AH] Leretha DykesHernandez, Yancy Hascall P, PA-C       Patient presents with abdominal pain, nausea, and vomiting. Patient is nontoxic, nonseptic appearing, in no apparent distress.  Patient's pain and other symptoms adequately managed in emergency department.  Fluid bolus and antiemetics given.  Labs, imaging and vitals reviewed. Suspect symptoms are  likely gallbladder related due to history and physical exam. Abdominal ultrasound reveals cholelithiasis without signs of acute cholecystitis. Patient does not meet the SIRS or Sepsis criteria.  On repeat exam patient does not have a surgical abdomin and there are no peritoneal signs.  No indication of appendicitis, bowel obstruction, bowel perforation, cholecystitis, diverticulitis, PID or ectopic pregnancy. Pain has improved while in the ER and patient has not had any vomiting. Patient discharged home with symptomatic treatment and given strict instructions for follow-up with their primary care physician. Provided referral for general surgery to discuss possible cholecystectomy. I have also discussed reasons to return immediately to the ER.  Patient expresses understanding and agrees with plan.  Final Clinical Impressions(s) / ED Diagnoses   Final diagnoses:  Abdominal pain, epigastric  Calculus of gallbladder without cholecystitis without obstruction    ED Discharge Orders         Ordered    ondansetron (ZOFRAN ODT) 4 MG disintegrating tablet  Every 8 hours PRN     05/12/19 0110           Leretha DykesHernandez, Eunice Winecoff P, PA-C 05/12/19 0155    Dione BoozeGlick, David, MD 05/12/19 [phone]

## 2019-05-12 ENCOUNTER — Ambulatory Visit: Payer: BC Managed Care – PPO | Admitting: Emergency Medicine

## 2019-05-12 ENCOUNTER — Other Ambulatory Visit: Payer: Self-pay

## 2019-05-12 ENCOUNTER — Encounter: Payer: Self-pay | Admitting: Emergency Medicine

## 2019-05-12 VITALS — BP 130/79 | HR 92 | Temp 98.5°F | Resp 16 | Ht 60.0 in | Wt 150.8 lb

## 2019-05-12 DIAGNOSIS — K8050 Calculus of bile duct without cholangitis or cholecystitis without obstruction: Secondary | ICD-10-CM

## 2019-05-12 DIAGNOSIS — K8020 Calculus of gallbladder without cholecystitis without obstruction: Secondary | ICD-10-CM

## 2019-05-12 MED ORDER — ONDANSETRON 4 MG PO TBDP: 4.0000 mg | ORAL_TABLET | Freq: Three times a day (TID) | ORAL | 0 refills | Status: DC | PRN

## 2019-05-12 MED ORDER — DICYCLOMINE HCL 20 MG PO TABS: 20.0000 mg | ORAL_TABLET | Freq: Four times a day (QID) | ORAL | 1 refills | Status: DC | PRN

## 2019-05-12 NOTE — Progress Notes (Signed)
Pamela Stanley 43 y.o.   Chief Complaint  Patient presents with  . Abdominal Pain    per patient went to emergency  yesterday - last night and they said I have Gallstones  ED the note from yesterday as follows: Patient presents with abdominal pain, nausea, and vomiting. Patient is nontoxic, nonseptic appearing, in no apparent distress.  Patient's pain and other symptoms adequately managed in emergency department.  Fluid bolus and antiemetics given.  Labs, imaging and vitals reviewed. Suspect symptoms are likely gallbladder related due to history and physical exam. Abdominal ultrasound reveals cholelithiasis without signs of acute cholecystitis. Patient does not meet the SIRS or Sepsis criteria.  On repeat exam patient does not have a surgical abdomin and there are no peritoneal signs.  No indication of appendicitis, bowel obstruction, bowel perforation, cholecystitis, diverticulitis, PID or ectopic pregnancy. Pain has improved while in the ER and patient has not had any vomiting. Patient discharged home with symptomatic treatment and given strict instructions for follow-up with their primary care physician. Provided referral for general surgery to discuss possible cholecystectomy. I have also discussed reasons to return immediately to the ER.  Patient expresses understanding and agrees with plan.  HISTORY OF PRESENT ILLNESS: This is a 43 y.o. female here for follow-up of emergency room visit yesterday.  Patient presented with right abdominal pain and diagnosed with cholelithiasis but no cholecystitis.  Denies fever or chills.  Still little nauseous but no vomiting.  Was prescribed Zofran from the emergency department but has not picked it up at the pharmacy yet.  Interpreter usedLenn Sink: HUE #[id number].  Doing better than yesterday.  No new symptoms.  HPI   Prior to Admission medications   Medication Sig Start Date End Date Taking? Authorizing Provider  mometasone (ELOCON) 0.1 % cream Apply 1 application  topically daily. 12/16/18   Wieters, Hallie C, PA-C  ondansetron (ZOFRAN ODT) 4 MG disintegrating tablet Take 1 tablet (4 mg total) by mouth every 8 (eight) hours as needed for nausea or vomiting. Patient not taking: Reported on 05/12/2019 05/12/19   Leretha DykesHernandez, Ana P, PA-C    No Known Allergies  Patient Active Problem List   Diagnosis Date Noted  . Flank pain 10/26/2018  . Language barrier 01/03/2013    Past Medical History:  Diagnosis Date  . GERD (gastroesophageal reflux disease)   . Ulcer     No past surgical history on file.  Social History   Socioeconomic History  . Marital status: Married    Spouse name: Not on file  . Number of children: 0  . Years of education: Not on file  . Highest education level: Not on file  Occupational History  . Not on file  Social Needs  . Financial resource strain: Not on file  . Food insecurity:    Worry: Not on file    Inability: Not on file  . Transportation needs:    Medical: Not on file    Non-medical: Not on file  Tobacco Use  . Smoking status: Never Smoker  . Smokeless tobacco: Never Used  Substance and Sexual Activity  . Alcohol use: No    Alcohol/week: 0.0 standard drinks  . Drug use: No  . Sexual activity: Yes    Birth control/protection: None  Lifestyle  . Physical activity:    Days per week: Not on file    Minutes per session: Not on file  . Stress: Not on file  Relationships  . Social connections:    Talks on phone:  Not on file    Gets together: Not on file    Attends religious service: Not on file    Active member of club or organization: Not on file    Attends meetings of clubs or organizations: Not on file    Relationship status: Not on file  . Intimate partner violence:    Fear of current or ex partner: Not on file    Emotionally abused: Not on file    Physically abused: Not on file    Forced sexual activity: Not on file  Other Topics Concern  . Not on file  Social History Narrative  . Not on file     Family History  Problem Relation Age of Onset  . Hyperlipidemia Mother   . Hypertension Mother   . Hypertension Father   . Hyperlipidemia Father      Review of Systems  Constitutional: Negative.  Negative for chills and fever.  Respiratory: Negative for cough and shortness of breath.   Cardiovascular: Negative for chest pain and palpitations.  Gastrointestinal: Positive for nausea. Negative for abdominal pain, blood in stool, diarrhea, melena and vomiting.  Genitourinary: Negative.   Skin: Negative.  Negative for rash.  Neurological: Negative for dizziness and headaches.  All other systems reviewed and are negative.  Vitals:   05/12/19 1547  BP: 130/79  Pulse: 92  Resp: 16  Temp: 98.5 F (36.9 C)  SpO2: 97%     Physical Exam Vitals signs reviewed.  Constitutional:      Appearance: She is well-developed.  HENT:     Head: Normocephalic and atraumatic.  Eyes:     Extraocular Movements: Extraocular movements intact.     Pupils: Pupils are equal, round, and reactive to light.  Neck:     Musculoskeletal: Normal range of motion and neck supple.  Cardiovascular:     Rate and Rhythm: Normal rate and regular rhythm.     Heart sounds: Normal heart sounds.  Pulmonary:     Effort: Pulmonary effort is normal.     Breath sounds: Normal breath sounds.  Abdominal:     General: There is no distension.     Palpations: Abdomen is soft.     Tenderness: There is no abdominal tenderness. There is no guarding.  Musculoskeletal: Normal range of motion.  Skin:    General: Skin is warm and dry.     Capillary Refill: Capillary refill takes less than 2 seconds.  Neurological:     General: No focal deficit present.     Mental Status: She is alert and oriented to person, place, and time.  Psychiatric:        Mood and Affect: Mood normal.        Behavior: Behavior normal.     [place] Abdomen Limited Ruq  Result Date: 05/11/2019 CLINICAL DATA:  Abdominal pain EXAM: ULTRASOUND ABDOMEN  LIMITED RIGHT UPPER QUADRANT COMPARISON:  None. FINDINGS: Gallbladder: Multiple gallstones are noted measuring up to approximately 7 mm. There is borderline gallbladder wall thickening, however the gallbladder is not significantly distended. The sonographic Eulah PontMurphy sign is reported as negative. There is no pericholecystic free fluid. Common bile duct: Diameter: 0.2 cm Liver: No focal lesion identified. Within normal limits in parenchymal echogenicity. Portal vein is patent on color Doppler imaging with normal direction of blood flow towards the liver. IMPRESSION: There is cholelithiasis without secondary signs of acute cholecystitis. Electronically Signed   By: Katherine Mantlehristopher  Green M.D.   On: 05/11/2019 23:33     ASSESSMENT &  PLAN: Leola was seen today for abdominal pain.  Diagnoses and all orders for this visit:  Calculus of gallbladder without cholecystitis without obstruction -     Ambulatory referral to General Surgery  Biliary colic symptom -     dicyclomine (BENTYL) 20 MG tablet; Take 1 tablet (20 mg total) by mouth every 6 (six) hours as needed for spasms.     Patient Instructions       If you have lab work done today you will be contacted with your lab results within the next 2 weeks.  If you have not heard from [place] then please contact us. The fastest way to get your results is to register for My Chart.   IF you received an x-ray today, you will receive an invoice from [facility] Radiology. Please contact [facility] Radiology at [phone] with questions or concerns regarding your invoice.   IF you received labwork today, you will receive an invoice from [facility]. Please contact [facility] at [phone] with questions or concerns regarding your invoice.   Our billing staff will not be able to assist you with questions regarding bills from these companies.  You will be contacted with the lab results as soon as they are available. The fastest way to get your results is to  activate your My Chart account. Instructions are located on the last page of this paperwork. If you have not heard from [place] regarding the results in 2 weeks, please contact this office.     B?nh s?i m?t Cholelithiasis  B?nh s?i m?t l m?t d?ng b?nh ? ti m?t trong ? s?i m?t hnh thnh trong ti m?t. Ti m?t l m?t c? quan l?u tr? m?t. M?t ???c s?n sinh trong gan v n gip tiu ha cc ch?t bo. S?i m?t b?t ??u d??i d?ng tinh th? nh? v d?n d?n pht tri?n thnh s?i. S?i khng gy ra tri?u ch?ng g cho ??n khi ti m?t th?t ch?t l?i (co l?i) v s?i m?t lm t?c ?ng d?n (c?n ?au qu?n m?t), tnh tr?ng ny c th? gy ?au. B?nh s?i m?t cn ???c g?i l s?i m?t. C hai lo?i s?i m?t chnh:  S?i cholesterol. S?i c c?u t?o t? cholesterol c?ng l?i v th??ng c mu vng-xanh. ?y l lo?i s?i m?t ph? bi?n nh?t. Cholesterol l m?t ch?t gi?ng m? c mu tr?ng, d?ng sp ???c s?n sinh trong gan.  S?i s?c t? m?t. ?y l lo?i s?i s?m mu v ???c t?o thnh t? m?t ch?t c mu ??-vng hnh thnh khi hemoglobin ? h?ng c?u v? ra (bilirubin). Nguyn nhn g gy ra? Nguyn nhn c?a tnh tr?ng ny c th? l do s? m?t cn b?ng trong cc ch?t c?u t?o m?t. Tnh tr?ng ny c th? x?y ra n?u m?t:  C qu nhi?u bilirubin.  C qu nhi?u cholesterol.  Khng c ?? mu?i m?t. Cc lo?i mu?i ny gip cho c? th? h?p thu v tiu ha cc ch?t bo. Trong m?t s? tr??ng h?p, nguyn nhn c?a tnh tr?ng ny c?ng c th? l do ti m?t khng x? h?t d?ch m?t ho?c x? d?ch m?t khng th??ng xuyn. ?i?u g lm t?ng nguy c?? Nh?ng y?u t? sau c th? lm qu v? d? b? tnh tr?ng ny h?n:  L ph? n?.  Mang ?a [place]. Chuyn gia ch?m [place] s?c kh?e ?i khi t? v?n lo?i b? s?i m?t tr??c khi mang thai nh?ng l?n ti?p theo.  ?n ch? ?? ?n nhi?u th?c ?n chin/rn, giu ch?t bo v  cc lo?i hy?at-cacbon tinh luy?n, nh? bnh m tr?ng v g?o tr?ng.  B? bo ph.  Trn 40 tu?i.  S? d?ng lu di c?a cc lo?i thu?c c hc mn n? (estrogen).  B? b?nh ti?u ???ng.   Gi?m cn nhanh.  C ti?n s? gia ?nh b? s?i m?t.  L ng??i da ?? M? ho?c ng??i c ngu?n g?c [place].  B? b?nh ???ng ru?t, ch?ng h?n nh? b?nh [place].  B? h?i ch?ng chuy?n ha.  B? b?nh x? gan.  B? cc lo?i thi?u mu n?ng nh? thi?u mu h?ng c?u hnh li?m. Cc d?u hi?u ho?c tri?u ch?ng l g? Trong h?u h?t cc tr??ng h?p, khng c tri?u ch?ng g. Nh?ng tr??ng h?p ny ???c g?i l s?i m?t th?m l?ng. N?u s?i m?t lm t?c ?ng d?n m?t, n c th? gy ra hi?n t??ng c?n ?au qu?n m?t. Tri?u ch?ng chnh c?a c?n ?au qu?n m?t l ?au ??t ng?t ? b?ng trn bn ph?i. C?n ?au th??ng ??n vo ban ?m ho?c sau khi ?n no. C?n ?au c th? ko di m?t ho?c vi gi? v c th? lan sang ng?c ho?c vai ph?i. N?u ?ng m?t b? t?c trn vi gi?, n c th? gy nhi?m trng ho?c vim ti m?t, vim gan, ho?c vim t?y, t? ? c th? d?n ??n:  Bu?n nn.  Nn.  ?au b?ng ko di t? 5 gi? tr? ln.  S?t ho?c ?n l?nh.  Gy vng da ho?c vng ph?n lng tr?ng m?t (ch?ng vng da).  N??c ti?u ??m mu.  Phn b?c mu. Ch?n ?on tnh tr?ng ny nh? th? no? Tnh tr?ng ny c th? ???c ch?n ?on d?a vo:  Khm th?c th?.  B?nh s? c?a qu v?.  Siu m ti m?t.  Ch?p CT.  Ch?p MRI.  Xt nghi?m mu ?? ki?m tra d?u hi?u nhi?m trng ho?c vim.  Ch?p ti m?t v ?ng d?n m?t (h? th?ng ???ng m?t) b?ng cch s? d?ng ch?t phng x? v h?i v camera ??c bi?t ?? c th? nhn th?y ch?t phng x? ? (cholescintigram). Ki?m tra ny s? ki?m tra xem ti m?t c?a qu v? co bp nh? th? no v ?ng d?n m?t c b? t?c hay khng.  Lu?n m?t ?ng nh? g?n camera ? ??u (?ng n?i soi) qua mi?ng ?? ki?m tra ?ng d?n m?t v ki?m tra xem c t?c ngh?n hay khng (n?i soi m?t - t?y ng??c dng). Tnh tr?ng ny ???c ?i?u tr? nh? th? no? Vi?c ?i?u tr? s?i m?t ty thu?c vo m??c ?? n?ng c?a b?nh. S?i m?t th?m l?ng khng c?n ?i?u tr?. C th? c?n ph?i ?i?u tr? n?u s?i m?t gy c?n ?au qu?n m?t ho?c cc tri?u ch?ng khc. Cc ph??ng n ?i?u tr? bao g?m:  Ph?u thu?t ?? lo?i b?  ti m?t (th? thu?t c?t ti m?t). ?y l ph??ng n ?i?u tr? ph? bi?n nh?t.  Thu?c ?? lm tan s?i m?t. ?y l cch ?i?u tr? hi?u qu? nh?t ??i v?i s?i m?t nh?[place]ell Heads. Qu v? c th? c?n dng thu?c t?i ?a t? 6-12 thng.  ?i?u tr? b?ng sng xung kch (th? thu?t tn s?i m?t ngoi c? th?). Trong ?i?u tr? ny, m?t my siu m truy?n sng xung kch ??n ti m?t ?? lm v? s?i m?t thnh nh?ng m?nh nh? h?n. Nh?ng m?nh nh? ny c th? tri vo ru?t non ho?c b? thu?c lm tan. Cch ny hi?m khi ????c s? d?ng.  Lo?i b? s?i m?t b?ng n?i soi m?t -  t?y ng??c dng. M?t ci gi? nh? c th? ???c g?n vo ?ng n?i soi v ???c s? d?ng ?? b?t gi? v l?y b? s?i m?t. Tun th? nh?ng h??ng d?n ny ? nh:  Ch? s? d?ng thu?c khng k ??n v thu?c k ??n theo ch? d?n c?a chuyn gia ch?m [place] s?c kh?e.  Duy tr cn n?ng c l?i cho s?c kh?e v tun th? ch? ?? ?n lnh m?nh. Vi?c ny bao g?m: ? Gi?m cc th?c ph?m c nhi?u m?, ch?ng h?n nh? th?c ph?m chin/rn. ? Gi?m cc hy?at-cacbon tinh, nh? bnh m tr?ng v g?o tr?ng. ? T?ng ch?t x?. Nh?m ??n cc th?c ph?m nh? h?nh nhn, tri cy v cc lo?i ??u.  Tun th? t?t c? cc l?n khm theo di theo ch? d?n c?a chuyn gia ch?m [place] s?c kh?e. ?i?u ny c vai tr quan tr?ng. Hy lin l?c v?i chuyn gia ch?m [place] s?c kh?e n?u:  Qu v? ngh? r?ng qu v? b? c?n ?au qu?n m?t.  Qu v? ???c ch?n ?on b? s?i m?t th?m l?ng v qu v? b? ?au b?ng ho?c kh tiu. Yu c?u tr? gip ngay l?p t?c n?u:  Qu v? ?au do b? c?n ?au qu?n m?t ko di trn 2 gi?Ladell Heads v? ?au b?ng ko di trn 5 gi?Ladell Heads v? b? s?t ho?c ?n l?nh.  Qu v? b? bu?n nn v nn m?a lin t?c.  Qu v? b? vng da.  Qu v? c n??c ti?u ??m mu ho?c phn b?c mu. Tm t?t  B?nh s?i m?t (cn ???c g?i l s?i m?t) l m?t d?ng b?nh ? ti m?t, trong ? s?i hnh thnh trong ti m?t.  Nguyn nhn c?a tnh tr?ng ny l do s? m?t cn b?ng trong cc ch?t t?o m?t. Tnh tr?ng ny c th? x?y ra n?u m?t c qu nhi?u cholesterol, qu nhi?u bilirubin, ho?c  khng ?? mu?i m?t.  Qu v? d? b? tnh tr?ng ny h?n n?u qu v? l ph? n?, ?ang mang [place], s? d?ng thu?c c estrogen, bo ph, trn 40 tu?i, ho?c c ti?n s? gia ?nh b? s?i m?t. Qu v? c?ng c th? b? s?i m?t n?u qu v? b? ti?u ???ng, b?nh ???ng ru?t, b?nh x? gan, ho?c h?i ch?ng chuy?n ha.  Vi?c ?i?u tr? s?i m?t ty thu?c vo m??c ?? n?ng c?a b?nh. S?i m?t th?m l?ng khng c?n ?i?u tr?.  C th? c?n ph?i ?i?u tr? n?u s?i m?t gy ra c?n ?au qu?n m?t ho?c cc tri?u ch?ng khc. Ph??ng php ?i?u tr? ph? bi?n nh?t l ph?u thu?t ?? lo?i b? ti m?t. Thng tin ny khng nh?m m?c ?ch thay th? cho l?i khuyn m chuyn gia ch?m [place] s?c kh?e ni v?i qu v?. Hy b?o ??m qu v? ph?i th?o lu?n b?t k? v?n ?? g m qu v? c v?i chuyn gia ch?m [place] s?c kh?e c?a qu v?. Document Released: 12/15/2015 Document Revised: 08/15/2017 Document Reviewed: 05/12/2013 Elsevier Interactive Patient Education  2019 Elsevier Inc.      Edwina Barth, MD [facility]

## 2019-05-12 NOTE — Discharge Instructions (Signed)
You have been seen today for abdominal pain. Please read and follow all provided instructions.   1. Medications: zofran (for nausea/vomiting), usual home medications 2. Treatment: rest, drink plenty of fluids 3. Follow Up: Please call and follow up with general surgery. Please follow up with your primary doctor in 2 days for discussion of your diagnoses and further evaluation after today's visit; if you do not have a primary care doctor use the resource guide provided to find one; Please return to the ER for any new or worsening symptoms. Please obtain all of your results from medical records or have your doctors office obtain the results - share them with your doctor - you should be seen at your doctors office. Call today to arrange your follow up.   Take medications as prescribed. Please review all of the medicines and only take them if you do not have an allergy to them. Return to the emergency room for worsening condition or new concerning symptoms. Follow up with your regular doctor. If you don't have a regular doctor use one of the numbers below to establish a primary care doctor.  Please be aware that if you are taking birth control pills, taking other prescriptions, ESPECIALLY ANTIBIOTICS may make the birth control ineffective - if this is the case, either do not engage in sexual activity or use alternative methods of birth control such as condoms until you have finished the medicine and your family doctor says it is OK to restart them. If you are on a blood thinner such as COUMADIN, be aware that any other medicine that you take may cause the coumadin to either work too much, or not enough - you should have your coumadin level rechecked in next 7 days if this is the case.  ?  It is also a possibility that you have an allergic reaction to any of the medicines that you have been prescribed - Everybody reacts differently to medications and while MOST people have no trouble with most medicines, you  may have a reaction such as nausea, vomiting, rash, swelling, shortness of breath. If this is the case, please stop taking the medicine immediately and contact your physician.  ?  You should return to the ER if you develop severe or worsening symptoms.   Emergency Department Resource Guide 1) Find a Doctor and Pay Out of Pocket Although you won't have to find out who is covered by your insurance plan, it is a good idea to ask around and get recommendations. You will then need to call the office and see if the doctor you have chosen will accept you as a new patient and what types of options they offer for patients who are self-pay. Some doctors offer discounts or will set up payment plans for their patients who do not have insurance, but you will need to ask so you aren't surprised when you get to your appointment.  2) Contact Your Local Health Department Not all health departments have doctors that can see patients for sick visits, but many do, so it is worth a call to see if yours does. If you don't know where your local health department is, you can check in your phone book. The CDC also has a tool to help you locate your state's health department, and many state websites also have listings of all of their local health departments.  3) Find a [facility] If your illness is not likely to be very severe or complicated, you may want to  try a walk in clinic. These are popping up all over the country in pharmacies, drugstores, and shopping centers. They're usually staffed by nurse practitioners or physician assistants that have been trained to treat common illnesses and complaints. They're usually fairly quick and inexpensive. However, if you have serious medical issues or chronic medical problems, these are probably not your best option.  No Primary Care Doctor: Call Health Connect at  [phone] - they can help you locate a primary care doctor that  accepts your insurance, provides certain services,  etc. Physician Referral Service- [phone]  Chronic Pain Problems: Organization         Address  Phone   Notes  [facility]  [phone] Patients need to be referred by their primary care doctor.   Medication Assistance: Organization         Address  Phone   Notes  [facility] Medication [facility] [address] [place], [place] [address] [phone] --Must be a resident of [facility] -- Must have NO insurance coverage whatsoever (no Medicaid/ Medicare, etc.) -- The pt. MUST have a primary care doctor that directs their care regularly and follows them in the community   [facility]  [phone]   [employer]  [phone]    Agencies that provide inexpensive medical care: Organization         Address  Phone   Notes  [facility]  [phone]   [facility]    [phone]   [facility] [place], [place] [address] [phone]   [place] [address] [place] [phone]   [facility]    [phone]   [facility]    [phone]   [place] [address], [place] Phone:  [phone], Fax:  [phone] Hours of Operation:  9 am - 6 pm, M-F.  Also accepts Medicaid/Medicare and self-pay.  [facility] for [place], [address], [place] Phone: [phone], Fax: [phone]. Hours of Operation:  8:30 am - 5:30 pm, M-F.  Also accepts Medicaid and self-pay.  [facility] [address], [place] Phone: [phone]   [place], [place], [place] [phone], Ext. [phone] Mondays & Thursdays: 7-9 AM.  First 15 patients are seen on a first come, first serve basis.    [place] Providers:  Organization         Address  Phone   Notes  [facility] [address], [place] [phone] Also accepts self-pay patients.  [facility] [address], [place]  [phone]   [address], [address], [place] [phone]   [facility] [address] [place] [phone]   Lucianne Lei [address], [place]   [phone] Only accepts [place] [facility] [place] patients after they have their name applied to their card.   Self-Pay (no insurance) in [facility]:  Organization         Address  Phone   Notes  Sickle Cell Patients, [facility] [place] [phone]   [facility] [place] [phone]   Gershon Mussel  [facility]  [address], [address], [place] [phone]   [facility]/Dr. Osei-Bonsu  [address], [place] or [address], [place] [phone] Phone number for both [place] and [place] locations is the same.  [facility] [address] [place] [phone]   [facility] [address] [place] or [address] [phone]   [facility] [address] [place] [phone], phone; [phone], fax Sees patients 1st and 3rd Saturday of every month.  Must not qualify for public or private insurance (i.e. Medicaid, Medicare, [place] Health Choice, Veterans' Benefits)  Household income should be no more than 200% of the poverty level The clinic cannot treat you if you are pregnant or think you are pregnant  Sexually transmitted diseases are not treated at the clinic.

## 2019-05-12 NOTE — ED Notes (Signed)
Patient verbalizes understanding of discharge instructions. Opportunity for questioning and answers were provided. Armband removed by staff, pt discharged from ED.  

## 2019-05-12 NOTE — Patient Instructions (Addendum)
If you have lab work done today you will be contacted with your lab results within the next 2 weeks.  If you have not heard from [place] then please contact us. The fastest way to get your results is to register for My Chart.   IF you received an x-ray today, you will receive an invoice from [facility]. Please contact [facility] at [phone] with questions or concerns regarding your invoice.   IF you received labwork today, you will receive an invoice from [facility]. Please contact [facility] at [phone] with questions or concerns regarding your invoice.   Our billing staff will not be able to assist you with questions regarding bills from these companies.  You will be contacted with the lab results as soon as they are available. The fastest way to get your results is to activate your My Chart account. Instructions are located on the last page of this paperwork. If you have not heard from [place] regarding the results in 2 weeks, please contact this office.     B?nh s?i m?t Cholelithiasis  B?nh s?i m?t l m?t d?ng b?nh ? ti m?t trong ? s?i m?t hnh thnh trong ti m?t. Ti m?t l m?t c? quan l?u tr? m?t. M?t ???c s?n sinh trong gan v n gip tiu ha cc ch?t bo. S?i m?t b?t ??u d??i d?ng tinh th? nh? v d?n d?n pht tri?n thnh s?i. S?i khng gy ra tri?u ch?ng g cho ??n khi ti m?t th?t ch?t l?i (co l?i) v s?i m?t lm t?c ?ng d?n (c?n ?au qu?n m?t), tnh tr?ng ny c th? gy ?au. B?nh s?i m?t cn ???c g?i l s?i m?t. C hai lo?i s?i m?t chnh:  S?i cholesterol. S?i c c?u t?o t? cholesterol c?ng l?i v th??ng c mu vng-xanh. ?y l lo?i s?i m?t ph? bi?n nh?t. Cholesterol l m?t ch?t gi?ng m? c mu tr?ng, d?ng sp ???c s?n sinh trong gan.  S?i s?c t? m?t. ?y l lo?i s?i s?m mu v ???c t?o thnh t? m?t ch?t c mu ??-vng hnh thnh khi hemoglobin ? h?ng c?u v? ra (bilirubin). Nguyn nhn g gy ra? Nguyn nhn c?a tnh tr?ng ny c th? l do s? m?t cn b?ng  trong cc ch?t c?u t?o m?t. Tnh tr?ng ny c th? x?y ra n?u m?t:  C qu nhi?u bilirubin.  C qu nhi?u cholesterol.  Khng c ?? mu?i m?t. Cc lo?i mu?i ny gip cho c? th? h?p thu v tiu ha cc ch?t bo. Trong m?t s? tr??ng h?p, nguyn nhn c?a tnh tr?ng ny c?ng c th? l do ti m?t khng x? h?t d?ch m?t ho?c x? d?ch m?t khng th??ng xuyn. ?i?u g lm t?ng nguy c?? Nh?ng y?u t? sau c th? lm qu v? d? b? tnh tr?ng ny h?n:  L ph? n?.  Mang ?a [place]. Chuyn gia ch?m [place] s?c kh?e ?i khi t? v?n lo?i b? s?i m?t tr??c khi mang thai nh?ng l?n ti?p theo.  ?n ch? ?? ?n nhi?u th?c ?n chin/rn, giu ch?t bo v cc lo?i hy?at-cacbon tinh luy?n, nh? bnh m tr?ng v g?o tr?ng.  B? bo ph.  Trn 40 tu?i.  S? d?ng lu di c?a cc lo?i thu?c c hc mn n? (estrogen).  B? b?nh ti?u ???ng.  Gi?m cn nhanh.  C ti?n s? gia ?nh b? s?i m?t.  L ng??i da ?? M? ho?c ng??i c ngu?n g?c [place].  B? b?nh ???ng ru?t, ch?ng h?n nh? b?nh  [place].  B? h?i ch?ng chuy?n ha.  B? b?nh x? gan.  B? cc lo?i thi?u mu n?ng nh? thi?u mu h?ng c?u hnh li?m. Cc d?u hi?u ho?c tri?u ch?ng l g? Trong h?u h?t cc tr??ng h?p, khng c tri?u ch?ng g. Nh?ng tr??ng h?p ny ???c g?i l s?i m?t th?m l?ng. N?u s?i m?t lm t?c ?ng d?n m?t, n c th? gy ra hi?n t??ng c?n ?au qu?n m?t. Tri?u ch?ng chnh c?a c?n ?au qu?n m?t l ?au ??t ng?t ? b?ng trn bn ph?i. C?n ?au th??ng ??n vo ban ?m ho?c sau khi ?n no. C?n ?au c th? ko di m?t ho?c vi gi? v c th? lan sang ng?c ho?c vai ph?i. N?u ?ng m?t b? t?c trn vi gi?, n c th? gy nhi?m trng ho?c vim ti m?t, vim gan, ho?c vim t?y, t? ? c th? d?n ??n:  Bu?n nn.  Nn.  ?au b?ng ko di t? 5 gi? tr? ln.  S?t ho?c ?n l?nh.  Gy vng da ho?c vng ph?n lng tr?ng m?t (ch?ng vng da).  N??c ti?u ??m mu.  Phn b?c mu. Ch?n ?on tnh tr?ng ny nh? th? no? Tnh tr?ng ny c th? ???c ch?n ?on d?a vo:  Khm th?c th?.  B?nh s? c?a qu  v?.  Siu m ti m?t.  Ch?p CT.  Ch?p MRI.  Xt nghi?m mu ?? ki?m tra d?u hi?u nhi?m trng ho?c vim.  Ch?p ti m?t v ?ng d?n m?t (h? th?ng ???ng m?t) b?ng cch s? d?ng ch?t phng x? v h?i v camera ??c bi?t ?? c th? nhn th?y ch?t phng x? ? (cholescintigram). Ki?m tra ny s? ki?m tra xem ti m?t c?a qu v? co bp nh? th? no v ?ng d?n m?t c b? t?c hay khng.  Lu?n m?t ?ng nh? g?n camera ? ??u (?ng n?i soi) qua mi?ng ?? ki?m tra ?ng d?n m?t v ki?m tra xem c t?c ngh?n hay khng (n?i soi m?t - t?y ng??c dng). Tnh tr?ng ny ???c ?i?u tr? nh? th? no? Vi?c ?i?u tr? s?i m?t ty thu?c vo m??c ?? n?ng c?a b?nh. S?i m?t th?m l?ng khng c?n ?i?u tr?. C th? c?n ph?i ?i?u tr? n?u s?i m?t gy c?n ?au qu?n m?t ho?c cc tri?u ch?ng khc. Cc ph??ng n ?i?u tr? bao g?m:  Ph?u thu?t ?? lo?i b? ti m?t (th? thu?t c?t ti m?t). ?y l ph??ng n ?i?u tr? ph? bi?n nh?t.  Thu?c ?? lm tan s?i m?t. ?y l cch ?i?u tr? hi?u qu? nh?t ??i v?i s?i m?t nh?Ladell Heads. Qu v? c th? c?n dng thu?c t?i ?a t? 6-12 thng.  ?i?u tr? b?ng sng xung kch (th? thu?t tn s?i m?t ngoi c? th?). Trong ?i?u tr? ny, m?t my siu m truy?n sng xung kch ??n ti m?t ?? lm v? s?i m?t thnh nh?ng m?nh nh? h?n. Nh?ng m?nh nh? ny c th? tri vo ru?t non ho?c b? thu?c lm tan. Cch ny hi?m khi ????c s? d?ng.  Lo?i b? s?i m?t b?ng n?i soi m?t - t?y ng??c dng. M?t ci gi? nh? c th? ???c g?n vo ?ng n?i soi v ???c s? d?ng ?? b?t gi? v l?y b? s?i m?t. Tun th? nh?ng h??ng d?n ny ? nh:  Ch? s? d?ng thu?c khng k ??n v thu?c k ??n theo ch? d?n c?a chuyn gia ch?m [place] s?c kh?e.  Duy tr cn n?ng c l?i cho s?c kh?e v tun th? ch? ?? ?n lnh  m?nh. Vi?c ny bao g?m: ? Gi?m cc th?c ph?m c nhi?u m?, ch?ng h?n nh? th?c ph?m chin/rn. ? Gi?m cc hy?at-cacbon tinh, nh? bnh m tr?ng v g?o tr?ng. ? T?ng ch?t x?. Nh?m ??n cc th?c ph?m nh? h?nh nhn, tri cy v cc lo?i ??u.  Tun th? t?t c? cc l?n khm theo di theo  ch? d?n c?a chuyn gia ch?m [place] s?c kh?e. ?i?u ny c vai tr quan tr?ng. Hy lin l?c v?i chuyn gia ch?m [place] s?c kh?e n?u:  Qu v? ngh? r?ng qu v? b? c?n ?au qu?n m?t.  Qu v? ???c ch?n ?on b? s?i m?t th?m l?ng v qu v? b? ?au b?ng ho?c kh tiu. Yu c?u tr? gip ngay l?p t?c n?u:  Qu v? ?au do b? c?n ?au qu?n m?t ko di trn 2 gi?Ladell Heads.  Qu v? ?au b?ng ko di trn 5 gi?Ladell Heads.  Qu v? b? s?t ho?c ?n l?nh.  Qu v? b? bu?n nn v nn m?a lin t?c.  Qu v? b? vng da.  Qu v? c n??c ti?u ??m mu ho?c phn b?c mu. Tm t?t  B?nh s?i m?t (cn ???c g?i l s?i m?t) l m?t d?ng b?nh ? ti m?t, trong ? s?i hnh thnh trong ti m?t.  Nguyn nhn c?a tnh tr?ng ny l do s? m?t cn b?ng trong cc ch?t t?o m?t. Tnh tr?ng ny c th? x?y ra n?u m?t c qu nhi?u cholesterol, qu nhi?u bilirubin, ho?c khng ?? mu?i m?t.  Qu v? d? b? tnh tr?ng ny h?n n?u qu v? l ph? n?, ?ang mang [place], s? d?ng thu?c c estrogen, bo ph, trn 40 tu?i, ho?c c ti?n s? gia ?nh b? s?i m?t. Qu v? c?ng c th? b? s?i m?t n?u qu v? b? ti?u ???ng, b?nh ???ng ru?t, b?nh x? gan, ho?c h?i ch?ng chuy?n ha.  Vi?c ?i?u tr? s?i m?t ty thu?c vo m??c ?? n?ng c?a b?nh. S?i m?t th?m l?ng khng c?n ?i?u tr?.  C th? c?n ph?i ?i?u tr? n?u s?i m?t gy ra c?n ?au qu?n m?t ho?c cc tri?u ch?ng khc. Ph??ng php ?i?u tr? ph? bi?n nh?t l ph?u thu?t ?? lo?i b? ti m?t. Thng tin ny khng nh?m m?c ?ch thay th? cho l?i khuyn m chuyn gia ch?m [place] s?c kh?e ni v?i qu v?. Hy b?o ??m qu v? ph?i th?o lu?n b?t k? v?n ?? g m qu v? c v?i chuyn gia ch?m [place] s?c kh?e c?a qu v?. Document Released: 12/15/2015 Document Revised: 08/15/2017 Document Reviewed: 05/12/2013 Elsevier Interactive Patient Education  2019 [employer]

## 2019-05-13 LAB — URINE CULTURE

## 2019-07-15 ENCOUNTER — Emergency Department (HOSPITAL_COMMUNITY)
Admission: EM | Admit: 2019-07-15 | Discharge: 2019-07-15 | Disposition: A | Discharge status: Home / Self Care | Payer: BC Managed Care – PPO | Attending: Emergency Medicine | Admitting: Emergency Medicine

## 2019-07-15 ENCOUNTER — Other Ambulatory Visit: Payer: Self-pay

## 2019-07-15 ENCOUNTER — Emergency Department (HOSPITAL_COMMUNITY): Payer: BC Managed Care – PPO

## 2019-07-15 ENCOUNTER — Encounter (HOSPITAL_COMMUNITY): Payer: Self-pay

## 2019-07-15 DIAGNOSIS — K8020 Calculus of gallbladder without cholecystitis without obstruction: Secondary | ICD-10-CM | POA: Diagnosis not present

## 2019-07-15 DIAGNOSIS — R1031 Right lower quadrant pain: Secondary | ICD-10-CM | POA: Diagnosis present

## 2019-07-15 HISTORY — DX: Calculus of gallbladder without cholecystitis without obstruction: K80.20

## 2019-07-15 LAB — BASIC METABOLIC PANEL
Anion gap: 13 (ref 5–15)
BUN: 7 mg/dL (ref 6–20)
CO2: 25 mmol/L (ref 22–32)
Calcium: 9.7 mg/dL (ref 8.9–10.3)
Chloride: 100 mmol/L (ref 98–111)
Creatinine, Ser: 0.84 mg/dL (ref 0.44–1.00)
GFR calc Af Amer: >60 mL/min (ref >60)
GFR calc non Af Amer: >60 mL/min (ref >60)
Glucose, Bld: 147 mg/dL — ABNORMAL HIGH (ref 70–99)
Potassium: 3.7 mmol/L (ref 3.5–5.1)
Sodium: 138 mmol/L (ref 135–145)

## 2019-07-15 LAB — CBC
HCT: 40.6 % (ref 36.0–46.0)
Hemoglobin: 13.5 g/dL (ref 12.0–15.0)
MCH: 29.9 pg (ref 26.0–34.0)
MCHC: 33.3 g/dL (ref 30.0–36.0)
MCV: 90 fL (ref 80.0–100.0)
Platelets: 310 10*3/uL (ref 150–400)
RBC: 4.51 MIL/uL (ref 3.87–5.11)
RDW: 11.8 % (ref 11.5–15.5)
WBC: 12.4 10*3/uL — ABNORMAL HIGH (ref 4.0–10.5)
nRBC: 0 % (ref 0.0–0.2)

## 2019-07-15 LAB — HEPATIC FUNCTION PANEL
ALT: 12 U/L (ref 0–44)
AST: 18 U/L (ref 15–41)
Albumin: 4.1 g/dL (ref 3.5–5.0)
Alkaline Phosphatase: 48 U/L (ref 38–126)
Bilirubin, Direct: <0.1 mg/dL (ref 0.0–0.2)
Total Bilirubin: 0.4 mg/dL (ref 0.3–1.2)
Total Protein: 7.6 g/dL (ref 6.5–8.1)

## 2019-07-15 LAB — I-STAT BETA HCG BLOOD, ED (MC, WL, AP ONLY): I-stat hCG, quantitative: <5 m[IU]/mL (ref <5)

## 2019-07-15 LAB — URINALYSIS, ROUTINE W REFLEX MICROSCOPIC
Bilirubin Urine: NEGATIVE
Glucose, UA: NEGATIVE mg/dL
Ketones, ur: NEGATIVE mg/dL
Nitrite: NEGATIVE
Protein, ur: NEGATIVE mg/dL
Specific Gravity, Urine: 1.013 (ref 1.005–1.030)
pH: 7 (ref 5.0–8.0)

## 2019-07-15 LAB — LIPASE, BLOOD: Lipase: 34 U/L (ref 11–51)

## 2019-07-15 MED ORDER — HYDROCODONE-ACETAMINOPHEN 5-325 MG PO TABS: 1.0000 | ORAL_TABLET | ORAL | 0 refills | Status: DC | PRN

## 2019-07-15 MED ORDER — ONDANSETRON HCL 4 MG/2ML IJ SOLN
4.0000 mg | Freq: Once | INTRAMUSCULAR | Status: AC
  Administered 2019-07-15: 06:00:00 4 mg via INTRAVENOUS
  Filled 2019-07-15: qty 2

## 2019-07-15 MED ORDER — MORPHINE SULFATE (PF) 4 MG/ML IV SOLN
4.0000 mg | Freq: Once | INTRAVENOUS | Status: AC
  Administered 2019-07-15: 4 mg via INTRAVENOUS
  Filled 2019-07-15: qty 1

## 2019-07-15 NOTE — ED Triage Notes (Signed)
Pt with c/o R sided flank pain from back to abd; pt states that she was dx with gallstones about 2 months ago after vomitting; 6/10

## 2019-07-15 NOTE — ED Provider Notes (Signed)
Care transferred to me. Patient reports minimal pain. Pain was worst in RUQ, though no tenderness now. She feels well for discharge. Mild WBC elevation, but other labs benign. I doubt cholecystitis. Discussed plan through the interpreter, we discussed need for outpatient surgery follow up and strict return precautions.  Results for orders placed or performed during the hospital encounter of 82/50/53  Basic metabolic panel  Result Value Ref Range   Sodium 138 135 - 145 mmol/L   Potassium 3.7 3.5 - 5.1 mmol/L   Chloride 100 98 - 111 mmol/L   CO2 25 22 - 32 mmol/L   Glucose, Bld 147 (H) 70 - 99 mg/dL   BUN 7 6 - 20 mg/dL   Creatinine, Ser 0.84 0.44 - 1.00 mg/dL   Calcium 9.7 8.9 - 10.3 mg/dL   GFR calc non Af Amer >60 >60 mL/min   GFR calc Af Amer >60 >60 mL/min   Anion gap 13 5 - 15  CBC  Result Value Ref Range   WBC 12.4 (H) 4.0 - 10.5 K/uL   RBC 4.51 3.87 - 5.11 MIL/uL   Hemoglobin 13.5 12.0 - 15.0 g/dL   HCT 40.6 36.0 - 46.0 %   MCV 90.0 80.0 - 100.0 fL   MCH 29.9 26.0 - 34.0 pg   MCHC 33.3 30.0 - 36.0 g/dL   RDW 11.8 11.5 - 15.5 %   Platelets 310 150 - 400 K/uL   nRBC 0.0 0.0 - 0.2 %  Urinalysis, Routine w reflex microscopic  Result Value Ref Range   Color, Urine YELLOW YELLOW   APPearance CLEAR CLEAR   Specific Gravity, Urine 1.013 1.005 - 1.030   pH 7.0 5.0 - 8.0   Glucose, UA NEGATIVE NEGATIVE mg/dL   Hgb urine dipstick LARGE (A) NEGATIVE   Bilirubin Urine NEGATIVE NEGATIVE   Ketones, ur NEGATIVE NEGATIVE mg/dL   Protein, ur NEGATIVE NEGATIVE mg/dL   Nitrite NEGATIVE NEGATIVE   Leukocytes,Ua TRACE (A) NEGATIVE   RBC / HPF 11-20 0 - 5 RBC/hpf   WBC, UA 0-5 0 - 5 WBC/hpf   Bacteria, UA RARE (A) NONE SEEN   Squamous Epithelial / LPF 0-5 0 - 5   Mucus PRESENT   Hepatic function panel  Result Value Ref Range   Total Protein 7.6 6.5 - 8.1 g/dL   Albumin 4.1 3.5 - 5.0 g/dL   AST 18 15 - 41 U/L   ALT 12 0 - 44 U/L   Alkaline Phosphatase 48 38 - 126 U/L   Total  Bilirubin 0.4 0.3 - 1.2 mg/dL   Bilirubin, Direct <0.1 0.0 - 0.2 mg/dL   Indirect Bilirubin NOT CALCULATED 0.3 - 0.9 mg/dL  Lipase, blood  Result Value Ref Range   Lipase 34 11 - 51 U/L  I-Stat beta hCG blood, ED  Result Value Ref Range   I-stat hCG, quantitative <5.0 <5 mIU/mL   Comment 3           US Abdomen Limited  Result Date: 07/15/2019 CLINICAL DATA:  Right upper quadrant pain EXAM: ULTRASOUND ABDOMEN LIMITED RIGHT UPPER QUADRANT COMPARISON:  May 11, 2019 FINDINGS: Gallbladder: There is an 8 mm calculus which appears adherent in the neck of the gallbladder. The gallbladder wall does not appear appreciably thickened, and there is no pericholecystic fluid. No sonographic Murphy sign noted by sonographer. Common bile duct: Diameter: 6 mm. No intrahepatic or extrahepatic biliary duct dilatation. Liver: No focal lesion identified. Within normal limits in parenchymal echogenicity. Portal vein is patent  on color Doppler imaging with normal direction of blood flow towards the liver. Other: None. IMPRESSION: 8 mm calculus which appears adherent in the neck of the gallbladder. No appreciable gallbladder wall thickening w or pericholecystic fluid. Study otherwise unremarkable. Electronically Signed   By: Bretta BangWilliam  Woodruff III M.D.   On: 07/15/2019 07:53      Pricilla LovelessGoldston, Wilfred Dayrit, MD 07/15/19 [phone]

## 2019-07-15 NOTE — ED Notes (Signed)
Via interpreter pt verbalized understanding of d/c instructions and has no further questions, VSS, NAD.

## 2019-07-15 NOTE — ED Notes (Signed)
Pt returned from US

## 2019-07-15 NOTE — ED Provider Notes (Signed)
[facility] EMERGENCY DEPARTMENT Provider Note   CSN: [id number] Arrival date & time: 07/15/19  0134     History   Chief Complaint Chief Complaint  Patient presents with  . Flank Pain    R side    HPI Pamela Stanley is a 43 y.o. female.     HPI  43 year old female comes in a chief complaint of R flank pain/abd pain. Patient was diagnosed with cholelithiasis few weeks ago.  Around 11 PM she started having severe abdominal pain.  The pain is located in the right upper quadrant and radiating to the flank region.  She has associated nausea without vomiting.   Past Medical History:  Diagnosis Date  . Gallstones   . GERD (gastroesophageal reflux disease)   . Ulcer     Patient Active Problem List   Diagnosis Date Noted  . Calculus of gallbladder without cholecystitis without obstruction 05/12/2019  . Biliary colic symptom 25/04/3975  . Flank pain 10/26/2018  . Language barrier 01/03/2013    History reviewed. No pertinent surgical history.   OB History   No obstetric history on file.      Home Medications    Prior to Admission medications   Medication Sig Start Date End Date Taking? Authorizing Provider  dicyclomine (BENTYL) 20 MG tablet Take 1 tablet (20 mg total) by mouth every 6 (six) hours as needed for spasms. 05/12/19  Yes Sagardia, Ines Bloomer, MD  pantoprazole (PROTONIX) 40 MG tablet Take 1 tablet by mouth daily. 06/17/19  Yes [provider]  mometasone (ELOCON) 0.1 % cream Apply 1 application topically daily. Patient not taking: Reported on 07/15/2019 12/16/18   Wieters, Hallie C, PA-C  ondansetron (ZOFRAN ODT) 4 MG disintegrating tablet Take 1 tablet (4 mg total) by mouth every 8 (eight) hours as needed for nausea or vomiting. Patient not taking: Reported on 05/12/2019 05/12/19   Arville Lime, PA-C    Family History Family History  Problem Relation Age of Onset  . Hyperlipidemia Mother   . Hypertension Mother   .  Hypertension Father   . Hyperlipidemia Father     Social History Social History   Tobacco Use  . Smoking status: Never Smoker  . Smokeless tobacco: Never Used  Substance Use Topics  . Alcohol use: No    Alcohol/week: 0.0 standard drinks  . Drug use: No     Allergies   Patient has no known allergies.   Review of Systems Review of Systems  Constitutional: Positive for activity change.  Respiratory: Negative for chest tightness.   Cardiovascular: Negative for chest pain.  Gastrointestinal: Positive for abdominal pain and nausea.  Genitourinary: Negative for dysuria.  Allergic/Immunologic: Negative for immunocompromised state.  All other systems reviewed and are negative.    Physical Exam Updated Vital Signs BP 136/82   Pulse 84   Temp 98.2 F (36.8 C) (Oral)   Resp 16   LMP 07/15/2019   SpO2 98%   Physical Exam Vitals signs and nursing note reviewed.  Constitutional:      Appearance: She is well-developed.  HENT:     Head: Normocephalic and atraumatic.  Neck:     Musculoskeletal: Normal range of motion and neck supple.  Cardiovascular:     Rate and Rhythm: Normal rate.  Pulmonary:     Effort: Pulmonary effort is normal.  Abdominal:     General: Bowel sounds are normal.     Tenderness: There is abdominal tenderness. There is guarding.  Comments: Right upper quadrant tenderness without Murphy's  Skin:    General: Skin is warm and dry.  Neurological:     Mental Status: She is alert and oriented to person, place, and time.      ED Treatments / Results  Labs (all labs ordered are listed, but only abnormal results are displayed) Labs Reviewed  BASIC METABOLIC PANEL - Abnormal; Notable for the following components:      Result Value   Glucose, Bld 147 (*)    All other components within normal limits  CBC - Abnormal; Notable for the following components:   WBC 12.4 (*)    All other components within normal limits  URINALYSIS, ROUTINE W REFLEX  MICROSCOPIC - Abnormal; Notable for the following components:   Hgb urine dipstick LARGE (*)    Leukocytes,Ua TRACE (*)    Bacteria, UA RARE (*)    All other components within normal limits  HEPATIC FUNCTION PANEL  LIPASE, BLOOD  I-STAT BETA HCG BLOOD, ED ([facility], [facility], [facility] ONLY)    EKG None  Radiology No results found.  Procedures Procedures (including critical care time)  Medications Ordered in ED Medications  morphine 4 MG/ML injection 4 mg (4 mg Intravenous Given 07/15/19 0626)  ondansetron (ZOFRAN) injection 4 mg (4 mg Intravenous Given 07/15/19 0629)     Initial Impression / Assessment and Plan / ED Course  I have reviewed the triage vital signs and the nursing notes.  Pertinent labs & imaging results that were available during my care of the patient were reviewed by me and considered in my medical decision making (see chart for details).        DDx includes: Pancreatitis Hepatobiliary pathology including cholecystitis Gastritis/PUD SBO ACS syndrome Aortic Dissection  43 year old comes in a chief complaint of abdominal pain. She has known history of gallstones from a visit to the ER few days ago.  Patient allegedly is awaiting a follow-up appointment with surgery.  She started having sudden onset pain in her right lower quadrant radiating to the flank region.  She has negative Murphy's, but she is guarding, and right upper quadrant ultrasound ordered.  Labs are reassuring.  Dr. Hedwig MortonGoldson to follow-up on the imaging.  Final Clinical Impressions(s) / ED Diagnoses   Final diagnoses:  None    ED Discharge Orders    None       Derwood KaplanNanavati, Pamela Sakurai, MD 07/15/19 [phone]

## 2019-07-15 NOTE — Discharge Instructions (Signed)
If you develop worsening, continued, or recurrent abdominal pain, uncontrolled vomiting, fever, chest or back pain, or any other new/concerning symptoms then return to the ER for evaluation.   It is very important to follow up with a surgeon as an outpatient to talk about surgery as an outpatient.  N?u b?n pht tri?n n?ng h?n, ?au b?ng lin t?c ho?c ti pht, nn m?a khng ki?m sot, s?t, ?au ng?c ho?c l?ng, ho?c b?t k? tri?u ch?ng m?i / lin quan no khc, hy quay l?i Phng khm c?p c?u ?? ?nh gi.  ?i?u r?t quan tr?ng l ph?i theo di bc s? ph?u thu?t v?i t? cch l b?nh nhn ngo?i tr ?? ni v? ph?u thu?t v?i t? cch l b?nh nhn ngo?i tr.

## 2019-08-13 ENCOUNTER — Other Ambulatory Visit: Payer: Self-pay

## 2019-08-13 ENCOUNTER — Ambulatory Visit
Admission: EM | Admit: 2019-08-13 | Discharge: 2019-08-13 | Disposition: A | Discharge status: Home / Self Care | Payer: BC Managed Care – PPO | Attending: Emergency Medicine | Admitting: Emergency Medicine

## 2019-08-13 DIAGNOSIS — M79642 Pain in left hand: Secondary | ICD-10-CM | POA: Diagnosis not present

## 2019-08-13 DIAGNOSIS — M79602 Pain in left arm: Secondary | ICD-10-CM

## 2019-08-13 MED ORDER — NAPROXEN 375 MG PO TABS: 375.0000 mg | ORAL_TABLET | Freq: Two times a day (BID) | ORAL | 0 refills | Status: DC

## 2019-08-13 NOTE — ED Triage Notes (Signed)
Pt presents to UC w/ c/o of left hand pain on top of hand since 3 days ago. Pt denies taking any OTC pain meds for it. Pt reports she noticed the top of her hand slightly swollen since 3 months ago but starting having pain 3 days ago. Pain is a shooting, stretching pain that worsens when lifting things. Pt states her left arm feels heavy at times.

## 2019-08-13 NOTE — ED Provider Notes (Signed)
[place]   [id number] 08/13/19 Arrival Time: 1214  NA:TFTD hand PAIN  SUBJECTIVE: History from: Family/ friend: Pamela Stanley is a 43 y.o. female complains of left hand / arm pain that began 3 days ago.  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  Localizes the pain to the top of left hand and radiates up arm.  Describes the pain as intermittent, "shooting," and 3/10.  Has NOT tried OTC medications.  Symptoms are made worse with holding weight in left arm.  Denies similar symptoms in the past.  Denies fever, chills, erythema, ecchymosis, weakness, numbness and tingling, neck pain.    ROS: As per HPI.  All other pertinent ROS negative.     Past Medical History:  Diagnosis Date  . Gallstones   . GERD (gastroesophageal reflux disease)   . Ulcer    History reviewed. No pertinent surgical history. No Known Allergies No current facility-administered medications on file prior to encounter.    Current Outpatient Medications on File Prior to Encounter  Medication Sig Dispense Refill  . [DISCONTINUED] dicyclomine (BENTYL) 20 MG tablet Take 1 tablet (20 mg total) by mouth every 6 (six) hours as needed for spasms. 20 tablet 1  . [DISCONTINUED] pantoprazole (PROTONIX) 40 MG tablet Take 1 tablet by mouth daily.     Social History   Socioeconomic History  . Marital status: Married    Spouse name: Not on file  . Number of children: 0  . Years of education: Not on file  . Highest education level: Not on file  Occupational History  . Not on file  Social Needs  . Financial resource strain: Not on file  . Food insecurity    Worry: Not on file    Inability: Not on file  . Transportation needs    Medical: Not on file    Non-medical: Not on file  Tobacco Use  . Smoking status: Never Smoker  . Smokeless tobacco: Never Used  Substance and Sexual Activity  . Alcohol use: No    Alcohol/week: 0.0 standard drinks  . Drug use: No  . Sexual activity: Yes    Birth  control/protection: None  Lifestyle  . Physical activity    Days per week: Not on file    Minutes per session: Not on file  . Stress: Not on file  Relationships  . Social Herbalist on phone: Not on file    Gets together: Not on file    Attends religious service: Not on file    Active member of club or organization: Not on file    Attends meetings of clubs or organizations: Not on file    Relationship status: Not on file  . Intimate partner violence    Fear of current or ex partner: Not on file    Emotionally abused: Not on file    Physically abused: Not on file    Forced sexual activity: Not on file  Other Topics Concern  . Not on file  Social History Narrative  . Not on file   Family History  Problem Relation Age of Onset  . Hyperlipidemia Mother   . Hypertension Mother   . Hypertension Father   . Hyperlipidemia Father     OBJECTIVE:  Vitals:   08/13/19 1228  BP: 130/85  Pulse: 79  Resp: 18  Temp: 98.2 F (36.8 C)  TempSrc: Oral  SpO2: 98%    General appearance: ALERT; in no acute distress.  Head: NCAT Lungs:  Normal respiratory effort CV: Radial pulses 2+ bilaterally. Cap refill < 2 seconds Musculoskeletal: Left hand/ arm Inspection/ palpation: mild TTP and soft tissue swelling over dorsal aspect in between the proximal 2nd and 3rd MCs ROM: FROM active and passive Strength: 5/5 shld abduction, 5/5 shld adduction, 5/5 elbow flexion, 5/5 elbow extension, 5/5 grip strength Skin: warm and dry Neurologic: Ambulates without difficulty; Sensation intact about the upper/ lower extremities Psychological: alert and cooperative; normal mood and affect  ASSESSMENT & PLAN:  1. Left hand pain   2. Left arm pain     Meds ordered this encounter  Medications  . naproxen (NAPROSYN) 375 MG tablet    Sig: Take 1 tablet (375 mg total) by mouth 2 (two) times daily.    Dispense:  20 tablet    Refill:  0    Order Specific Question:   Supervising Provider     Answer:   Eustace MooreELSON, YVONNE SUE [[id number]]    Continue conservative management of rest, ice, and elevation Wrist splint given Take naproxen as needed for pain relief (Pamela cause abdominal discomfort, ulcers, and GI bleeds avoid taking with other NSAIDs) Follow up with PCP in 1-2 weeks for recheck and to ensure symptoms are improving Return or go to the ER if you have any new or worsening symptoms (fever, chills, chest pain, increased redness, swelling, pain, persistent symptoms despite medication, etc...)   Reviewed expectations re: course of current medical issues. Questions answered. Outlined signs and symptoms indicating need for more acute intervention. Patient verbalized understanding. After Visit Summary given.    Rennis HardingWurst, Pamela Ozment, PA-C 08/13/19 1344

## 2019-08-13 NOTE — Discharge Instructions (Signed)
Continue conservative management of rest, ice, and elevation Wrist splint given Take naproxen as needed for pain relief (may cause abdominal discomfort, ulcers, and GI bleeds avoid taking with other NSAIDs) Follow up with PCP in 1-2 weeks for recheck and to ensure symptoms are improving Return or go to the ER if you have any new or worsening symptoms (fever, chills, chest pain, increased redness, swelling, pain, persistent symptoms despite medication, etc...)

## 2019-08-30 ENCOUNTER — Encounter: Payer: BC Managed Care – PPO | Admitting: Emergency Medicine

## 2019-09-07 ENCOUNTER — Ambulatory Visit: Payer: BC Managed Care – PPO | Admitting: Emergency Medicine

## 2019-09-07 ENCOUNTER — Other Ambulatory Visit: Payer: Self-pay

## 2019-09-07 ENCOUNTER — Encounter: Payer: Self-pay | Admitting: Emergency Medicine

## 2019-09-07 VITALS — BP 125/84 | HR 81 | Temp 97.7°F | Resp 16 | Ht 61.0 in | Wt 139.0 lb

## 2019-09-07 DIAGNOSIS — M67442 Ganglion, left hand: Secondary | ICD-10-CM

## 2019-09-07 NOTE — Patient Instructions (Addendum)
If you have lab work done today you will be contacted with your lab results within the next 2 weeks.  If you have not heard from [place] then please contact us. The fastest way to get your results is to register for My Chart.   IF you received an x-ray today, you will receive an invoice from [facility]. Please contact [facility] at [phone] with questions or concerns regarding your invoice.   IF you received labwork today, you will receive an invoice from [place]. Please contact [facility] at [phone] with questions or concerns regarding your invoice.   Our billing staff will not be able to assist you with questions regarding bills from these companies.  You will be contacted with the lab results as soon as they are available. The fastest way to get your results is to activate your My Chart account. Instructions are located on the last page of this paperwork. If you have not heard from [place] regarding the results in 2 weeks, please contact this office.     Nang ho?t d?ch Ganglion Cyst  Nang ho?t d?ch l m?t c?c u khng ph?i ung th?, ch?a ??y d?ch xu?t hi?n g?n kh?p ho?c g?n gn. Nang pht tri?n ngoi kh?p ho?c ngoi l?p b?c gn. Nang ho?t d?ch th??ng hay pht tri?n ? bn tay ho?c c? tay nh?t, nh?ng c?ng c th? pht tri?n ? vai, khu?u tay, hng, ??u g?i, m?t c chn, ho?c bn chn. Nang ho?t d?ch hnh qu? bng ho?c hnh qu? tr?ng. Kch th??c nang c th? thay ??i t? h?t ??u ??n l?n h?n tri nho. T?ng c??ng ho?t ??ng c th? khi?n nang l?n h?n v ch?t l?ng b?t ??u tch t? nhi?u h?n. Nguyn nhn g gy ra? Khng r nguyn nhn chnh xc gy ra tnh tr?ng ny, nh?ng n c th? lin quan ??n:  Vim ho?c kch thch xung quanh kh?p.  Ch?n th??ng.  Chuy?n ??ng l?p ?i l?p l?i ho?c s? d?ng qu m?c.  Vim kh?p. ?i?u g lm t?ng nguy c?? Qu v? d? b? tnh tr?ng ny h?n n?u:  Qu v? l ph? n?.  Qu v? 15-40 tu?i. Cc d?u hi?u ho?c tri?u ch?ng l g? Tri?u ch?ng chnh  c?a tnh tr?ng ny l m?t c?c. N th??ng xu?t hi?n nh?t ? bn tay ho?c c? tay. Trong nhi?u tr??ng h?p, khng c tri?u ch?ng no khc, nh?ng nang ?i khi c th? gy:  ?au nhi.  ?au.  T b.  Y?u c?.  N?m tay y?u.  Ph?m vi c? ??ng c?a kh?p t h?n. Ch?n ?on tnh tr?ng ny nh? th? no? Nang ho?t d?ch th??ng ???c ch?n ?on d?a trn khm th?c th?Paulino Rily gia ch?m [place] s?c kh?e c?a qu v? s? s? th?y c?c v c th? chi?u ?n sng bn c?nh c?c ?. N?u l nang ho?t d?ch, nh sng c th? s? ?i xuyn qua nang. Chuyn gia ch?m [place] s?c kh?e c?a qu v? c th? yu c?u ch?p X-quang, siu m ho?c MRI ?? lo?i tr? cc b?nh khc. Tnh tr?ng ny ???c ?i?u tr? nh? th? no? Nang ho?t d?ch th??ng t? h?t m khng c?n ?i?u tr?. N?u b? ?au ho?c c cc tri?u ch?ng khc, qu v? c th? c?n ?i?u tr?. Th??ng c?n ?i?u tr? n?u nang ho?t d?ch h?n ch? c? ??ng c?a qu v? ho?c n?u nang b? nhi?m trng. ?i?u tr? c th? bao g?m:  ?eo n?p ho?c vng vo c? tay ho?c ngn tay.  Dng  thu?c ch?ng vim.  D?n l?u d?ch t? c?c ? b?ng kim tim (ht).  Tim steroid vo kh?p.  Ph?u thu?t ?? lo?i b? nang ho?t d?ch.  ??t mi?ng ??m ln giy ho?c ?i giy s? khng c? vo nang n?u nang n?m ? bn chn. Tun th? nh?ng h??ng d?n ny ? nh:  Khng ?n vo nang ho?t d?ch, ch?c kim vo nang, ho?c ??p vo nang.  Ch? s? d?ng thu?c khng k ??n v thu?c k ??n theo ch? d?n c?a chuyn gia ch?m [place] s?c kh?e.  N?u qu v? s? d?ng n?p ho?c dy ?eo: ? Hy ?eo theo ch? d?n c?a chuyn gia ch?m [place] s?c kh?e. ? Tho ra theo ch? d?n c?a chuyn gia ch?m [place] s?c kh?e. H?i xem qu v? c c?n tho ra khi t?m d??i vi sen ho?c t?m b?n khng.  Quan st xem nang ho?t d?ch c b?t c? thay ??i no khng.  Tun th? t?t c? cc l?n khm theo di theo ch? d?n c?a chuyn gia ch?m [place] s?c kh?e. ?i?u ny c vai tr quan tr?ng. Hy lin l?c v?i chuyn gia ch?m [place] s?c kh?e n?u:  Nang ho?t d?ch tr? nn l?n h?n ho?c ?au h?n.  C m? ? nang ch?y ra.  Qu v? c c?m gic  y?u ho?c t ? vng b? ?nh h??ng.  Qu v? b? s?t ho?c ?n l?nh. Yu c?u tr? gip ngay l?p t?c n?u:  Qu v? b? s?t v c b?t k? tri?u ch?ng no sau ?y ? vng c nang: ? T?ng t?y ??. ? Cc s?c ??. ? S?ng n?. Tm t?t  Nang ho?t d?ch l m?t c?c u khng ph?i ung th?, ch?a ??y d?ch xu?t hi?n g?n kh?p ho?c g?n gn.  Nang ho?t d?ch th??ng hay pht tri?n ? bn tay ho?c c? tay nh?t, nh?ng c?ng c th? pht tri?n ? vai, khu?u tay, hng, ??u g?i, m?t c chn, ho?c bn chn.  Nang ho?t d?ch th??ng t? h?t m khng c?n ?i?u tr?Gery Pray tin ny khng nh?m m?c ?ch thay th? cho l?i khuyn m chuyn gia ch?m [place] s?c kh?e ni v?i qu v?. Hy b?o ??m qu v? ph?i th?o lu?n b?t k? v?n ?? g m qu v? c v?i chuyn gia ch?m  s?c kh?e c?a qu v?. Document Released: 08/28/2005 Document Revised: 11/05/2017 Document Reviewed: 11/05/2017 Elsevier Patient Education  2020 [employer].

## 2019-09-07 NOTE — Progress Notes (Signed)
Pamela Stanley 43 y.o.   Chief Complaint  Patient presents with  . Hand Pain    Left hand - per patient concerned about the swelling on top of hand    HISTORY OF PRESENT ILLNESS: This is a 43 y.o. female complaining of lump on her left hand for several weeks.  No injuries.  No other symptoms.  HPI   Prior to Admission medications   Medication Sig Start Date End Date Taking? Authorizing Provider  naproxen (NAPROSYN) 375 MG tablet Take 1 tablet (375 mg total) by mouth 2 (two) times daily. Patient not taking: Reported on 09/07/2019 08/13/19   Wurst, [place], PA-C  dicyclomine (BENTYL) 20 MG tablet Take 1 tablet (20 mg total) by mouth every 6 (six) hours as needed for spasms. 05/12/19 08/13/19  Georgina Quint, MD  pantoprazole (PROTONIX) 40 MG tablet Take 1 tablet by mouth daily. 06/17/19 08/13/19  [provider]    Not on File  Patient Active Problem List   Diagnosis Date Noted  . Language barrier 01/03/2013    Past Medical History:  Diagnosis Date  . Gallstones   . GERD (gastroesophageal reflux disease)   . Ulcer     History reviewed. No pertinent surgical history.  Social History   Socioeconomic History  . Marital status: Married    Spouse name: Not on file  . Number of children: 0  . Years of education: Not on file  . Highest education level: Not on file  Occupational History  . Not on file  Social Needs  . Financial resource strain: Not on file  . Food insecurity    Worry: Not on file    Inability: Not on file  . Transportation needs    Medical: Not on file    Non-medical: Not on file  Tobacco Use  . Smoking status: Never Smoker  . Smokeless tobacco: Never Used  Substance and Sexual Activity  . Alcohol use: No    Alcohol/week: 0.0 standard drinks  . Drug use: No  . Sexual activity: Yes    Birth control/protection: None  Lifestyle  . Physical activity    Days per week: Not on file    Minutes per session: Not on file  . Stress: Not on  file  Relationships  . Social Musician on phone: Not on file    Gets together: Not on file    Attends religious service: Not on file    Active member of club or organization: Not on file    Attends meetings of clubs or organizations: Not on file    Relationship status: Not on file  . Intimate partner violence    Fear of current or ex partner: Not on file    Emotionally abused: Not on file    Physically abused: Not on file    Forced sexual activity: Not on file  Other Topics Concern  . Not on file  Social History Narrative  . Not on file    Family History  Problem Relation Age of Onset  . Hyperlipidemia Mother   . Hypertension Mother   . Hypertension Father   . Hyperlipidemia Father      Review of Systems  Constitutional: Negative.  Negative for chills and fever.  HENT: Negative.  Negative for congestion and sore throat.   Respiratory: Negative.  Negative for cough and shortness of breath.   Cardiovascular: Negative.  Negative for chest pain and palpitations.  Gastrointestinal: Negative for abdominal pain, diarrhea, nausea  and vomiting.  Genitourinary: Negative.   Musculoskeletal: Negative.   Skin: Negative.  Negative for rash.  Neurological: Negative.  Negative for dizziness and headaches.  All other systems reviewed and are negative.  Vitals:   09/07/19 1406  BP: 125/84  Pulse: 81  Resp: 16  Temp: 97.7 F (36.5 C)  SpO2: 98%     Physical Exam Vitals signs reviewed.  Constitutional:      Appearance: Normal appearance.  HENT:     Head: Normocephalic.  Eyes:     Extraocular Movements: Extraocular movements intact.     Pupils: Pupils are equal, round, and reactive to light.  Neck:     Musculoskeletal: Normal range of motion.  Cardiovascular:     Rate and Rhythm: Normal rate.  Pulmonary:     Effort: Pulmonary effort is normal.  Musculoskeletal:     Comments: Left hand: Positive ganglionic cyst distal second MCP joint area.  No erythema.   Minimal tenderness.  No swelling.  Neurovascularly intact.  Full range of motion.  Skin:    General: Skin is warm and dry.     Capillary Refill: Capillary refill takes less than 2 seconds.  Neurological:     General: No focal deficit present.     Mental Status: She is alert and oriented to person, place, and time.  Psychiatric:        Mood and Affect: Mood normal.        Behavior: Behavior normal.      ASSESSMENT & PLAN: Aurelia was seen today for hand pain.  Diagnoses and all orders for this visit:  Ganglion cyst of finger of left hand    Patient Instructions       If you have lab work done today you will be contacted with your lab results within the next 2 weeks.  If you have not heard from [place] then please contact us. The fastest way to get your results is to register for My Chart.   IF you received an x-ray today, you will receive an invoice from [facility]. Please contact [facility] at [phone] with questions or concerns regarding your invoice.   IF you received labwork today, you will receive an invoice from [place]. Please contact [facility] at [phone] with questions or concerns regarding your invoice.   Our billing staff will not be able to assist you with questions regarding bills from these companies.  You will be contacted with the lab results as soon as they are available. The fastest way to get your results is to activate your My Chart account. Instructions are located on the last page of this paperwork. If you have not heard from [place] regarding the results in 2 weeks, please contact this office.     Nang ho?t d?ch Ganglion Cyst  Nang ho?t d?ch l m?t c?c u khng ph?i ung th?, ch?a ??y d?ch xu?t hi?n g?n kh?p ho?c g?n gn. Nang pht tri?n ngoi kh?p ho?c ngoi l?p b?c gn. Nang ho?t d?ch th??ng hay pht tri?n ? bn tay ho?c c? tay nh?t, nh?ng c?ng c th? pht tri?n ? vai, khu?u tay, hng, ??u g?i, m?t c chn, ho?c bn chn. Nang ho?t  d?ch hnh qu? bng ho?c hnh qu? tr?ng. Kch th??c nang c th? thay ??i t? h?t ??u ??n l?n h?n tri nho. T?ng c??ng ho?t ??ng c th? khi?n nang l?n h?n v ch?t l?ng b?t ??u tch t? nhi?u h?n. Nguyn nhn g gy ra? Khng r nguyn nhn chnh xc gy  ra tnh tr?ng ny, nh?ng n c th? lin quan ??n:  Vim ho?c kch thch xung quanh kh?p.  Ch?n th??ng.  Chuy?n ??ng l?p ?i l?p l?i ho?c s? d?ng qu m?c.  Vim kh?p. ?i?u g lm t?ng nguy c?? Qu v? d? b? tnh tr?ng ny h?n n?u:  Qu v? l ph? n?.  Qu v? 15-40 tu?i. Cc d?u hi?u ho?c tri?u ch?ng l g? Tri?u ch?ng chnh c?a tnh tr?ng ny l m?t c?c. N th??ng xu?t hi?n nh?t ? bn tay ho?c c? tay. Trong nhi?u tr??ng h?p, khng c tri?u ch?ng no khc, nh?ng nang ?i khi c th? gy:  ?au nhi.  ?au.  T b.  Y?u c?.  N?m tay y?u.  Ph?m vi c? ??ng c?a kh?p t h?n. Ch?n ?on tnh tr?ng ny nh? th? no? Nang ho?t d?ch th??ng ???c ch?n ?on d?a trn khm th?c th?Paulino Rily gia ch?m [place] s?c kh?e c?a qu v? s? s? th?y c?c v c th? chi?u ?n sng bn c?nh c?c ?. N?u l nang ho?t d?ch, nh sng c th? s? ?i xuyn qua nang. Chuyn gia ch?m [place] s?c kh?e c?a qu v? c th? yu c?u ch?p X-quang, siu m ho?c MRI ?? lo?i tr? cc b?nh khc. Tnh tr?ng ny ???c ?i?u tr? nh? th? no? Nang ho?t d?ch th??ng t? h?t m khng c?n ?i?u tr?. N?u b? ?au ho?c c cc tri?u ch?ng khc, qu v? c th? c?n ?i?u tr?. Th??ng c?n ?i?u tr? n?u nang ho?t d?ch h?n ch? c? ??ng c?a qu v? ho?c n?u nang b? nhi?m trng. ?i?u tr? c th? bao g?m:  ?eo n?p ho?c vng vo c? tay ho?c ngn tay.  Dng thu?c ch?ng vim.  D?n l?u d?ch t? c?c ? b?ng kim tim (ht).  Tim steroid vo kh?p.  Ph?u thu?t ?? lo?i b? nang ho?t d?ch.  ??t mi?ng ??m ln giy ho?c ?i giy s? khng c? vo nang n?u nang n?m ? bn chn. Tun th? nh?ng h??ng d?n ny ? nh:  Khng ?n vo nang ho?t d?ch, ch?c kim vo nang, ho?c ??p vo nang.  Ch? s? d?ng thu?c khng k ??n v thu?c k ??n theo ch?  d?n c?a chuyn gia ch?m [place] s?c kh?e.  N?u qu v? s? d?ng n?p ho?c dy ?eo: ? Hy ?eo theo ch? d?n c?a chuyn gia ch?m [place] s?c kh?e. ? Tho ra theo ch? d?n c?a chuyn gia ch?m [place] s?c kh?e. H?i xem qu v? c c?n tho ra khi t?m d??i vi sen ho?c t?m b?n khng.  Quan st xem nang ho?t d?ch c b?t c? thay ??i no khng.  Tun th? t?t c? cc l?n khm theo di theo ch? d?n c?a chuyn gia ch?m [place] s?c kh?e. ?i?u ny c vai tr quan tr?ng. Hy lin l?c v?i chuyn gia ch?m [place] s?c kh?e n?u:  Nang ho?t d?ch tr? nn l?n h?n ho?c ?au h?n.  C m? ? nang ch?y ra.  Qu v? c c?m gic y?u ho?c t ? vng b? ?nh h??ng.  Qu v? b? s?t ho?c ?n l?nh. Yu c?u tr? gip ngay l?p t?c n?u:  Qu v? b? s?t v c b?t k? tri?u ch?ng no sau ?y ? vng c nang: ? T?ng t?y ??. ? Cc s?c ??. ? S?ng n?. Tm t?t  Nang ho?t d?ch l m?t c?c u khng ph?i ung th?, ch?a ??y d?ch xu?t hi?n g?n kh?p ho?c g?n gn.  Nang ho?t d?ch th??ng hay pht tri?n ?  bn tay ho?c c? tay nh?t, nh?ng c?ng c th? pht tri?n ? vai, khu?u tay, hng, ??u g?i, m?t c chn, ho?c bn chn.  Nang ho?t d?ch th??ng t? h?t m khng c?n ?i?u tr?Gery Pray. Thng tin ny khng nh?m m?c ?ch thay th? cho l?i khuyn m chuyn gia ch?m [place] s?c kh?e ni v?i qu v?. Hy b?o ??m qu v? ph?i th?o lu?n b?t k? v?n ?? g m qu v? c v?i chuyn gia ch?m [place] s?c kh?e c?a qu v?. Document Released: 08/28/2005 Document Revised: 11/05/2017 Document Reviewed: 11/05/2017 Elsevier Patient Education  2020 [employer]      Edwina BarthMiguel Lakeita Panther, MD [facility]

## 2019-09-23 ENCOUNTER — Encounter: Payer: Self-pay | Admitting: Emergency Medicine

## 2019-09-23 ENCOUNTER — Other Ambulatory Visit: Payer: Self-pay

## 2019-09-23 ENCOUNTER — Ambulatory Visit: Payer: BC Managed Care – PPO | Admitting: Emergency Medicine

## 2019-09-23 VITALS — BP 125/83 | HR 78 | Temp 98.2°F | Resp 16 | Ht 61.0 in | Wt 141.2 lb

## 2019-09-23 DIAGNOSIS — L723 Sebaceous cyst: Secondary | ICD-10-CM

## 2019-09-23 DIAGNOSIS — Z7689 Persons encountering health services in other specified circumstances: Secondary | ICD-10-CM | POA: Diagnosis not present

## 2019-09-23 DIAGNOSIS — L089 Local infection of the skin and subcutaneous tissue, unspecified: Secondary | ICD-10-CM

## 2019-09-23 NOTE — Patient Instructions (Addendum)
If you have lab work done today you will be contacted with your lab results within the next 2 weeks.  If you have not heard from [place] then please contact us. The fastest way to get your results is to register for My Chart.   IF you received an x-ray today, you will receive an invoice from [facility]. Please contact [facility] at [phone] with questions or concerns regarding your invoice.   IF you received labwork today, you will receive an invoice from [place]. Please contact [facility] at [phone] with questions or concerns regarding your invoice.   Our billing staff will not be able to assist you with questions regarding bills from these companies.  You will be contacted with the lab results as soon as they are available. The fastest way to get your results is to activate your My Chart account. Instructions are located on the last page of this paperwork. If you have not heard from [place] regarding the results in 2 weeks, please contact this office.     V?t m? va? d?n l?u, Ch?m [place] sau thu? thu?t Incision and Drainage, Care After T? thng tin ny cung c?p cho qu v? thng tin v? cch t? ch?m [place] b?n thn sau khi lm th? thu?t. Chuyn gia ch?m [place] s?c kh?e c?ng c th? c h??ng d?n c? th? h?n cho qu v?. Hy lin l?c v?i chuyn gia ch?m [place] s?c kh?e n?u qu v? c cc v?n ?? ho?c th?c m?c. Ti c th? d? ki?n ?i?u g s? x?y ra sau khi lm th? thu?t? Sau khi lm th? thu?t th??ng b?:  ?au ho?c c?m gic kh ch?u quanh v?t m?.  Mu, ch?t l?ng ho?c m? (d?ch d?n l?u) ch?y ra ? v?t m?.  Da t?y ?? v c?ng quanh v? tr v?t m?. Tun th? nh?ng h??ng d?n ny ? nh: Thu?c  Ch? s? d?ng thu?c khng k ??n v thu?c k ??n theo ch? d?n c?a chuyn gia ch?m [place] s?c kh?e.  N?u qu v? ???c k thu?c khng sinh, hy s? d?ng thu?c theo ch? d?n c?a chuyn gia ch?m [place] s?c kh?e. Khng d?ng s? d?ng thu?c khng sinh ngay c? khi qu v? b?t ??u c?m th?y ?? h?n. Ch?m [place] v?t th??ng Tun  th? ch? ??n c?a chuyn gia ch?m [place] s?c kh?e v? cch ch?m [place] v?t th??ng. ??m b?o qu v?:  R?a tay b?ng x phng v n??c tr??c v sau khi qu v? thay b?ng (b?ng g?c). N?u khng c x phng v n??c, hy dng thu?c st trng tay.  Thay b?ng v v?t li?u b theo ch? d?n c?a chuyn gia ch?m [place] s?c kh?e. ? N?u b?ng b? kh ho?c b? m?c khi qu v? th? tho ra, hy lm ?m ho?c lm ??t b?ng b?ng n??c mu?i sinh l ho?c n??c ?? c th? tho b?ng m khng gy t?n h?i cho da ho?c m. ? N?u v?t th??ng c?a qu v? ???c b l?i, hy ?? yn cho ??n khi chuyn gia ch?m [place] s?c kh?e yu c?u qu v? tho ra. ?? tho v?t li?u b, dng n??c mu?i sinh l ho?c n??c ?? lm ?m ho?c lm ??t v?t li?u b sao cho c th? tho ra m khng gy t?n th??ng cho da ho?c m.  ?? nguyn cc m?i khu (ch? ph?u thu?t), keo dn da ho?c cc d?i b?ng dnh t?i ch?. Nh?ng th? dng ?? ?ng v?t m? trn da ny c th? c?n ph?i ??  nguyn t?i ch? trong 2 tu?n ho?c lu h?n. N?u cc mp d?i b?ng dnh b?t ??u long ra v cong ln, qu v? c th? c?t nh?ng ch? mp b? long ra ?Maggie Schwalbe bc h?t cc d?i b?ng dnh ra tr? khi chuyn gia ch?m [place] s?c kh?e b?o qu v? lm nh? v?y. Ki?m tra v?t th??ng m?i ngy xem c cc d?u hi?u nhi?m trng hay khng. Ki?m tra xem c:  ??, s?ng n?, ho?c ?au nhi?u h?n.  Nhi?u d?ch ho?c mu h?n.  ?m.  M? ho?c mi hi. N?u qu v? ???c cho v? nh cng v?i ?ng d?n l?u trn ng??i, hy lm theo h??ng d?n t? chuyn gia ch?m [place] s?c kh?e v?:  Cch ?? s?ch d?ch.  Cch ch?m [place] ?ng t?i nh.  H??ng d?n chung  ?? cho vng b? ?nh h??ng ngh? ng?i.  Khng t?m b?n, b?i, ho?c s? d?ng b?n t?m n??c nng cho ??n khi ???c chuyn gia ch?m [place] s?c kh?e c?a qu v? ch?p thu?n. Hy h?i chuyn gia ch?m [place] s?c kh?e xem qu v? c th? t?m vi hoa sen hay khng. Qu v? c th? ch? ???c php t?m b?ng mi?ng b?t bi?n.  Tr? l?i sinh ho?t bnh th??ng theo ch? d?n c?a chuyn gia ch?m [place] s?c kh?e. Hy h?i chuyn gia ch?m [place] s?c kh?e v? cc ho?t ??ng no l an  ton cho qu v?. Chuyn gia ch?m [place] s?c kh?e c th? yu c?u qu v? h?n ch? ho?t ??ng ho?c h?n ch? nng nh?c.  V?t m? s? ti?p t?c r? n??c. Vi?c c m?t t d?ch trong ho?c l?n cht mu l bnh th??ng. L??ng d?ch c?n gi?m m?i ngy.  Khng bi b?t k? lo?i kem, thu?c m?, ho?c ch?t l?ng no tr? khi chuyn gia ch?m [place] s?c kh?e yu c?u qu v? lm nh? v?y.  Tun th? t?t c? cc l?n khm theo di theo ch? d?n c?a chuyn gia ch?m [place] s?c kh?e. ?i?u ny c vai tr quan tr?ng. Hy lin l?c v?i chuyn gia ch?m [place] s?c kh?e n?u:  U nang hay p xe cu?a quy? vi? xu?t hi?n la?i.  Qu v? b? s?t ho?c ?n l?nh.  Qu v? b? t?y ??, s?ng ho?c ?au nhi?u h?n xung quanh v?t m?.  Qu v? b? ch?y nhi?u d?ch ho?c mu h?n ? v?t m?.  C?m th?y ?m khi s? vo v?t m? c?a qu v?.  Qu v? th?y m? ho?c mi hi thot ra ? v?t m?.  Qu v? c cc v?t s?c mu ?? ? trn ho?c ? d??i v? tr v?t m?. Yu c?u tr? gip ngay l?p t?c n?u:  Quy? vi? ?au d? d?i ho?c ch?y mu.  Qu v? khng th? ?n ho?c u?ng v vi?c ny khi?n qu v? b? nn.  Quy? vi? bi? gi?m l??ng n??c ti?u.  Qu v? th?y kh th?.  Qu v? b? ?au ng?c.  Qu v? ho ra mu.  Vng b? ?nh h??ng tr? nn t b ho?c b?t ??u ?au bu?t. Nh?ng tri?u ch?ng ny c th? l bi?u hi?n c?a m?t v?n ?? nghim tr?ng c?n c?p c?u. Khng ch? xem tri?u ch?ng c h?t khng. Hy ?i khm ngay l?p t?c. G?i cho d?ch v? c?p c?u t?i ??a ph??ng (911 ? [place] Khng t? li xe ??n b?nh vi?n. Tm t?t  Sau th? thu?t ny, th??ng c d?ch, mu, ho?c m? ch?y ra ? v? tr ph?u thu?t.  Tun th? t?t c? cc h??ng  d?n ch?m [place] t?i nh. Qu v? s? ???c cho bi?t cch ch?m [place] v?t m?, cch ki?m tra nhi?m trng v cch dng thu?c.  N?u qu v? ???c k thu?c khng sinh, hy dng thu?c theo ch? d?n c?a chuyn gia ch?m [place] s?c kh?e. Khng d?ng s? d?ng thu?c khng sinh ngay c? khi qu v? b?t ??u c?m th?y ?? h?n.  Lin h? v?i chuyn gia ch?m [place] s?c kh?e n?u qu v? b? t?y ??, s?ng ho?c ?au t?ng ln quanh v?t m?. Tm  s? tr? gip ngay l?p t?c n?u qu v? b? ?au ng?c, nn, ho ra mu ho?c kh th?.  Tun th? t?t c? cc l?n khm theo di theo ch? d?n c?a chuyn gia ch?m [place] s?c kh?e. ?i?u ny c vai tr quan tr?ng. Thng tin ny khng nh?m m?c ?ch thay th? cho l?i khuyn m chuyn gia ch?m [place] s?c kh?e ni v?i qu v?. Hy b?o ??m qu v? ph?i th?o lu?n b?t k? v?n ?? g m qu v? c v?i chuyn gia ch?m [place] s?c kh?e c?a qu v?. Document Released: 03/11/2016 Document Revised: 12/03/2018 Document Reviewed: 12/03/2018 Elsevier Patient Education  2020 [employer]

## 2019-09-23 NOTE — Progress Notes (Signed)
Pamela Stanley 43 y.o.   Chief Complaint  Patient presents with   Mass    UPPER BACK with pain and been there long time 8 years    HISTORY OF PRESENT ILLNESS: This is a 43 y.o. female complaining of tender cyst to mid upper back. No other significant symptoms. No other complaints or medical concerns today.  HPI   Prior to Admission medications   Medication Sig Start Date End Date Taking? Authorizing Provider  naproxen (NAPROSYN) 375 MG tablet Take 1 tablet (375 mg total) by mouth 2 (two) times daily. Patient not taking: Reported on 09/07/2019 08/13/19   Pamela Stanley, [place], PA-C  dicyclomine (BENTYL) 20 MG tablet Take 1 tablet (20 mg total) by mouth every 6 (six) hours as needed for spasms. 05/12/19 08/13/19  Pamela Quint, MD  pantoprazole (PROTONIX) 40 MG tablet Take 1 tablet by mouth daily. 06/17/19 08/13/19  [provider]    No Known Allergies  Patient Active Problem List   Diagnosis Date Noted   Language barrier 01/03/2013    Past Medical History:  Diagnosis Date   Gallstones    GERD (gastroesophageal reflux disease)    Ulcer     History reviewed. No pertinent surgical history.  Social History   Socioeconomic History   Marital status: Married    Spouse name: Not on file   Number of children: 0   Years of education: Not on file   Highest education level: Not on file  Occupational History   Not on file  Social Needs   Financial resource strain: Not on file   Food insecurity    Worry: Not on file    Inability: Not on file   Transportation needs    Medical: Not on file    Non-medical: Not on file  Tobacco Use   Smoking status: Never Smoker   Smokeless tobacco: Never Used  Substance and Sexual Activity   Alcohol use: No    Alcohol/week: 0.0 standard drinks   Drug use: No   Sexual activity: Yes    Birth control/protection: None  Lifestyle   Physical activity    Days per week: Not on file    Minutes per session: Not on  file   Stress: Not on file  Relationships   Social connections    Talks on phone: Not on file    Gets together: Not on file    Attends religious service: Not on file    Active member of club or organization: Not on file    Attends meetings of clubs or organizations: Not on file    Relationship status: Not on file   Intimate partner violence    Fear of current or ex partner: Not on file    Emotionally abused: Not on file    Physically abused: Not on file    Forced sexual activity: Not on file  Other Topics Concern   Not on file  Social History Narrative   Not on file    Family History  Problem Relation Age of Onset   Hyperlipidemia Mother    Hypertension Mother    Hypertension Father    Hyperlipidemia Father      Review of Systems  Constitutional: Negative.  Negative for chills and fever.  HENT: Negative.  Negative for congestion and sore throat.   Respiratory: Negative.  Negative for cough and shortness of breath.   Cardiovascular: Negative.  Negative for chest pain and palpitations.  Gastrointestinal: Negative.  Negative for abdominal pain, diarrhea,  nausea and vomiting.  Neurological: Negative.  Negative for dizziness and headaches.  All other systems reviewed and are negative.  Vitals:   09/23/19 0924  BP: 125/83  Pulse: 78  Resp: 16  Temp: 98.2 F (36.8 C)  SpO2: 99%     Physical Exam Vitals signs reviewed.  Constitutional:      Appearance: Normal appearance.  HENT:     Head: Normocephalic.  Eyes:     Extraocular Movements: Extraocular movements intact.     Pupils: Pupils are equal, round, and reactive to light.  Neck:     Musculoskeletal: Normal range of motion.  Cardiovascular:     Rate and Rhythm: Normal rate and regular rhythm.     Heart sounds: Normal heart sounds.  Pulmonary:     Effort: Pulmonary effort is normal.  Musculoskeletal: Normal range of motion.  Skin:    General: Skin is warm and dry.     Capillary Refill: Capillary  refill takes less than 2 seconds.     Comments: Tender and swollen sebaceous cyst to mid upper back with some fluctuation.  Minimal surrounding erythema.  Neurological:     General: No focal deficit present.     Mental Status: She is alert.     PROCEDURE NOTE: I&D of Abscess Verbal consent obtained. Local anesthesia with 5 cc of 2% lidocaine with epinephrine. Site cleansed with Betadine.  Incision of 2 cm was made using a 11 blade, discharge of large amounts of pus but mostly cystic material drained. Wound cavity was explored with curved hemostats and packed with one quarter" plain packing. Cleansed and dressed. After care instructions provided. Patient to return to clinic on 4 days for reevaluation/repacking.      ASSESSMENT & PLAN: Pamela Stanley was seen today for mass.  Diagnoses and all orders for this visit:  Infected sebaceous cyst  Encounter for incision and drainage procedure    Patient Instructions       If you have lab work done today you will be contacted with your lab results within the next 2 weeks.  If you have not heard from [place] then please contact us. The fastest way to get your results is to register for My Chart.   IF you received an x-ray today, you will receive an invoice from [facility]. Please contact [facility] at [phone] with questions or concerns regarding your invoice.   IF you received labwork today, you will receive an invoice from [place]. Please contact [facility] at [phone] with questions or concerns regarding your invoice.   Our billing staff will not be able to assist you with questions regarding bills from these companies.  You will be contacted with the lab results as soon as they are available. The fastest way to get your results is to activate your My Chart account. Instructions are located on the last page of this paperwork. If you have not heard from [place] regarding the results in 2 weeks, please contact this  office.     V?t m? va? d?n l?u, Ch?m [place] sau thu? thu?t Incision and Drainage, Care After T? thng tin ny cung c?p cho qu v? thng tin v? cch t? ch?m [place] b?n thn sau khi lm th? thu?t. Chuyn gia ch?m [place] s?c kh?e c?ng c th? c h??ng d?n c? th? h?n cho qu v?. Hy lin l?c v?i chuyn gia ch?m [place] s?c kh?e n?u qu v? c cc v?n ?? ho?c th?c m?c. Ti c th? d? ki?n ?i?u g s? x?y  ra sau khi lm th? thu?t? Sau khi lm th? thu?t th??ng b?:  ?au ho?c c?m gic kh ch?u quanh v?t m?.  Mu, ch?t l?ng ho?c m? (d?ch d?n l?u) ch?y ra ? v?t m?.  Da t?y ?? v c?ng quanh v? tr v?t m?. Tun th? nh?ng h??ng d?n ny ? nh: Thu?c  Ch? s? d?ng thu?c khng k ??n v thu?c k ??n theo ch? d?n c?a chuyn gia ch?m [place] s?c kh?e.  N?u qu v? ???c k thu?c khng sinh, hy s? d?ng thu?c theo ch? d?n c?a chuyn gia ch?m [place] s?c kh?e. Khng d?ng s? d?ng thu?c khng sinh ngay c? khi qu v? b?t ??u c?m th?y ?? h?n. Ch?m [place] v?t th??ng Tun th? ch? ??n c?a chuyn gia ch?m [place] s?c kh?e v? cch ch?m [place] v?t th??ng. ??m b?o qu v?:  R?a tay b?ng x phng v n??c tr??c v sau khi qu v? thay b?ng (b?ng g?c). N?u khng c x phng v n??c, hy dng thu?c st trng tay.  Thay b?ng v v?t li?u b theo ch? d?n c?a chuyn gia ch?m [place] s?c kh?e. ? N?u b?ng b? kh ho?c b? m?c khi qu v? th? tho ra, hy lm ?m ho?c lm ??t b?ng b?ng n??c mu?i sinh l ho?c n??c ?? c th? tho b?ng m khng gy t?n h?i cho da ho?c m. ? N?u v?t th??ng c?a qu v? ???c b l?i, hy ?? yn cho ??n khi chuyn gia ch?m [place] s?c kh?e yu c?u qu v? tho ra. ?? tho v?t li?u b, dng n??c mu?i sinh l ho?c n??c ?? lm ?m ho?c lm ??t v?t li?u b sao cho c th? tho ra m khng gy t?n th??ng cho da ho?c m.  ?? nguyn cc m?i khu (ch? ph?u thu?t), keo dn da ho?c cc d?i b?ng dnh t?i ch?. Nh?ng th? dng ?? ?ng v?t m? trn da ny c th? c?n ph?i ?? nguyn t?i ch? trong 2 tu?n ho?c lu h?n. N?u cc mp d?i b?ng dnh b?t ??u long ra v cong ln,  qu v? c th? c?t nh?ng ch? mp b? long ra ?Imagene Sheller bc h?t cc d?i b?ng dnh ra tr? khi chuyn gia ch?m [place] s?c kh?e b?o qu v? lm nh? v?y. Ki?m tra v?t th??ng m?i ngy xem c cc d?u hi?u nhi?m trng hay khng. Ki?m tra xem c:  ??, s?ng n?, ho?c ?au nhi?u h?n.  Nhi?u d?ch ho?c mu h?n.  ?m.  M? ho?c mi hi. N?u qu v? ???c cho v? nh cng v?i ?ng d?n l?u trn ng??i, hy lm theo h??ng d?n t? chuyn gia ch?m [place] s?c kh?e v?:  Cch ?? s?ch d?ch.  Cch ch?m [place] ?ng t?i nh.  H??ng d?n chung  ?? cho vng b? ?nh h??ng ngh? ng?i.  Khng t?m b?n, b?i, ho?c s? d?ng b?n t?m n??c nng cho ??n khi ???c chuyn gia ch?m [place] s?c kh?e c?a qu v? ch?p thu?n. Hy h?i chuyn gia ch?m [place] s?c kh?e xem qu v? c th? t?m vi hoa sen hay khng. Qu v? c th? ch? ???c php t?m b?ng mi?ng b?t bi?n.  Tr? l?i sinh ho?t bnh th??ng theo ch? d?n c?a chuyn gia ch?m [place] s?c kh?e. Hy h?i chuyn gia ch?m [place] s?c kh?e v? cc ho?t ??ng no l an ton cho qu v?. Chuyn gia ch?m [place] s?c kh?e c th? yu c?u qu v? h?n ch? ho?t ??ng ho?c h?n ch? nng nh?c.  V?t m?  s? ti?p t?c r? n??c. Vi?c c m?t t d?ch trong ho?c l?n cht mu l bnh th??ng. L??ng d?ch c?n gi?m m?i ngy.  Khng bi b?t k? lo?i kem, thu?c m?, ho?c ch?t l?ng no tr? khi chuyn gia ch?m [place] s?c kh?e yu c?u qu v? lm nh? v?y.  Tun th? t?t c? cc l?n khm theo di theo ch? d?n c?a chuyn gia ch?m [place] s?c kh?e. ?i?u ny c vai tr quan tr?ng. Hy lin l?c v?i chuyn gia ch?m [place] s?c kh?e n?u:  U nang hay p xe cu?a quy? vi? xu?t hi?n la?i.  Qu v? b? s?t ho?c ?n l?nh.  Qu v? b? t?y ??, s?ng ho?c ?au nhi?u h?n xung quanh v?t m?.  Qu v? b? ch?y nhi?u d?ch ho?c mu h?n ? v?t m?.  C?m th?y ?m khi s? vo v?t m? c?a qu v?.  Qu v? th?y m? ho?c mi hi thot ra ? v?t m?.  Qu v? c cc v?t s?c mu ?? ? trn ho?c ? d??i v? tr v?t m?. Yu c?u tr? gip ngay l?p t?c n?u:  Quy? vi? ?au d? d?i ho?c ch?y mu.  Qu v? khng th? ?n ho?c  u?ng v vi?c ny khi?n qu v? b? nn.  Quy? vi? bi? gi?m l??ng n??c ti?u.  Qu v? th?y kh th?.  Qu v? b? ?au ng?c.  Qu v? ho ra mu.  Vng b? ?nh h??ng tr? nn t b ho?c b?t ??u ?au bu?t. Nh?ng tri?u ch?ng ny c th? l bi?u hi?n c?a m?t v?n ?? nghim tr?ng c?n c?p c?u. Khng ch? xem tri?u ch?ng c h?t khng. Hy ?i khm ngay l?p t?c. G?i cho d?ch v? c?p c?u t?i ??a ph??ng (911 ? [place] Khng t? li xe ??n b?nh vi?n. Tm t?t  Sau th? thu?t ny, th??ng c d?ch, mu, ho?c m? ch?y ra ? v? tr ph?u thu?t.  Tun th? t?t c? cc h??ng d?n ch?m [place] t?i nh. Qu v? s? ???c cho bi?t cch ch?m [place] v?t m?, cch ki?m tra nhi?m trng v cch dng thu?c.  N?u qu v? ???c k thu?c khng sinh, hy dng thu?c theo ch? d?n c?a chuyn gia ch?m [place] s?c kh?e. Khng d?ng s? d?ng thu?c khng sinh ngay c? khi qu v? b?t ??u c?m th?y ?? h?n.  Lin h? v?i chuyn gia ch?m [place] s?c kh?e n?u qu v? b? t?y ??, s?ng ho?c ?au t?ng ln quanh v?t m?. Tm s? tr? gip ngay l?p t?c n?u qu v? b? ?au ng?c, nn, ho ra mu ho?c kh th?.  Tun th? t?t c? cc l?n khm theo di theo ch? d?n c?a chuyn gia ch?m [place] s?c kh?e. ?i?u ny c vai tr quan tr?ng. Thng tin ny khng nh?m m?c ?ch thay th? cho l?i khuyn m chuyn gia ch?m [place] s?c kh?e ni v?i qu v?. Hy b?o ??m qu v? ph?i th?o lu?n b?t k? v?n ?? g m qu v? c v?i chuyn gia ch?m [place] s?c kh?e c?a qu v?. Document Released: 03/11/2016 Document Revised: 12/03/2018 Document Reviewed: 12/03/2018 Elsevier Patient Education  2020 [employer] Inc.      Edwina BarthMiguel Devontre Siedschlag, MD [facility]

## 2019-09-27 ENCOUNTER — Other Ambulatory Visit: Payer: Self-pay

## 2019-09-27 ENCOUNTER — Ambulatory Visit: Payer: BC Managed Care – PPO | Admitting: Emergency Medicine

## 2019-09-27 ENCOUNTER — Encounter: Payer: Self-pay | Admitting: Emergency Medicine

## 2019-09-27 VITALS — BP 124/84 | HR 78 | Temp 98.1°F | Resp 16 | Ht 61.0 in | Wt 137.6 lb

## 2019-09-27 DIAGNOSIS — Z9889 Other specified postprocedural states: Secondary | ICD-10-CM

## 2019-09-27 DIAGNOSIS — L089 Local infection of the skin and subcutaneous tissue, unspecified: Secondary | ICD-10-CM

## 2019-09-27 DIAGNOSIS — L723 Sebaceous cyst: Secondary | ICD-10-CM

## 2019-09-27 NOTE — Progress Notes (Signed)
Pamela Stanley 43 y.o.   Chief Complaint  Patient presents with   Wound Check    FOLLOW UP ABCESS ON UPPER BACK    HISTORY OF PRESENT ILLNESS: This is a 43 y.o. female status post incision and drainage of sebaceous cyst on her upper mid back 09/23/2019 here for recheck.  Doing well.  No complaints.  HPI   Prior to Admission medications   Medication Sig Start Date End Date Taking? Authorizing Provider  naproxen (NAPROSYN) 375 MG tablet Take 1 tablet (375 mg total) by mouth 2 (two) times daily. Patient not taking: Reported on 09/07/2019 08/13/19   Stanley, [place], PA-C  dicyclomine (BENTYL) 20 MG tablet Take 1 tablet (20 mg total) by mouth every 6 (six) hours as needed for spasms. 05/12/19 08/13/19  Pamela Pollen, MD  pantoprazole (PROTONIX) 40 MG tablet Take 1 tablet by mouth daily. 06/17/19 08/13/19  [provider]    No Known Allergies  Patient Active Problem List   Diagnosis Date Noted   Language barrier 01/03/2013    Past Medical History:  Diagnosis Date   Gallstones    GERD (gastroesophageal reflux disease)    Ulcer     No past surgical history on file.  Social History   Socioeconomic History   Marital status: Married    Spouse name: Not on file   Number of children: 0   Years of education: Not on file   Highest education level: Not on file  Occupational History   Not on file  Social Needs   Financial resource strain: Not on file   Food insecurity    Worry: Not on file    Inability: Not on file   Transportation needs    Medical: Not on file    Non-medical: Not on file  Tobacco Use   Smoking status: Never Smoker   Smokeless tobacco: Never Used  Substance and Sexual Activity   Alcohol use: No    Alcohol/week: 0.0 standard drinks   Drug use: No   Sexual activity: Yes    Birth control/protection: None  Lifestyle   Physical activity    Days per week: Not on file    Minutes per session: Not on file   Stress: Not on  file  Relationships   Social connections    Talks on phone: Not on file    Gets together: Not on file    Attends religious service: Not on file    Active member of club or organization: Not on file    Attends meetings of clubs or organizations: Not on file    Relationship status: Not on file   Intimate partner violence    Fear of current or ex partner: Not on file    Emotionally abused: Not on file    Physically abused: Not on file    Forced sexual activity: Not on file  Other Topics Concern   Not on file  Social History Narrative   Not on file    Family History  Problem Relation Age of Onset   Hyperlipidemia Mother    Hypertension Mother    Hypertension Father    Hyperlipidemia Father      Review of Systems  Constitutional: Negative.  Negative for chills and fever.  HENT: Negative for congestion and sore throat.   Respiratory: Negative for cough.   Cardiovascular: Negative for chest pain.  Gastrointestinal: Negative for abdominal pain, diarrhea, nausea and vomiting.  Genitourinary: Negative.   Musculoskeletal: Negative for myalgias.  Neurological:  Negative for dizziness and headaches.  Endo/Heme/Allergies: Negative.    Today's Vitals   09/27/19 0843  BP: 124/84  Pulse: 78  Resp: 16  Temp: 98.1 F (36.7 C)  TempSrc: Oral  SpO2: 98%  Weight: 137 lb 9.6 oz (62.4 kg)  Height:  (1.549 m)   Body mass index is 26 kg/m.   Physical Exam Vitals signs reviewed.  Constitutional:      Appearance: Normal appearance.  HENT:     Head: Normocephalic.  Eyes:     Extraocular Movements: Extraocular movements intact.  Neck:     Musculoskeletal: Normal range of motion.  Cardiovascular:     Rate and Rhythm: Normal rate.  Pulmonary:     Effort: Pulmonary effort is normal.  Musculoskeletal: Normal range of motion.  Skin:    General: Skin is warm and dry.     Capillary Refill: Capillary refill takes less than 2 seconds.     Comments: Mid upper back:  Packing in place.  No erythema or swelling.  Healing well.  Looks better.  Packing removed.  No drainage or serosanguineous discharge.  Much improved.  Neurological:     General: No focal deficit present.     Mental Status: She is alert and oriented to person, place, and time.      ASSESSMENT & PLAN: Pamela Stanley was seen today for wound check.  Diagnoses and all orders for this visit:  Infected sebaceous cyst Comments: Much improved  Status post incision and drainage    Patient Instructions       If you have lab work done today you will be contacted with your lab results within the next 2 weeks.  If you have not heard from [place] then please contact us. The fastest way to get your results is to register for My Chart.   IF you received an x-ray today, you will receive an invoice from [facility]. Please contact [facility] at [phone] with questions or concerns regarding your invoice.   IF you received labwork today, you will receive an invoice from [place]. Please contact [facility] at [phone] with questions or concerns regarding your invoice.   Our billing staff will not be able to assist you with questions regarding bills from these companies.  You will be contacted with the lab results as soon as they are available. The fastest way to get your results is to activate your My Chart account. Instructions are located on the last page of this paperwork. If you have not heard from [place] regarding the results in 2 weeks, please contact this office.     V?t m? va? d?n l?u, Ch?m [place] sau thu? thu?t Incision and Drainage, Care After T? thng tin ny cung c?p cho qu v? thng tin v? cch t? ch?m [place] b?n thn sau khi lm th? thu?t. Chuyn gia ch?m [place] s?c kh?e c?ng c th? c h??ng d?n c? th? h?n cho qu v?. Hy lin l?c v?i chuyn gia ch?m [place] s?c kh?e n?u qu v? c cc v?n ?? ho?c th?c m?c. Ti c th? d? ki?n ?i?u g s? x?y ra sau khi lm th? thu?t? Sau khi lm th?  thu?t th??ng b?:  ?au ho?c c?m gic kh ch?u quanh v?t m?.  Mu, ch?t l?ng ho?c m? (d?ch d?n l?u) ch?y ra ? v?t m?.  Da t?y ?? v c?ng quanh v? tr v?t m?. Tun th? nh?ng h??ng d?n ny ? nh: Thu?c  Ch? s? d?ng thu?c khng k ??n v thu?c  k ??n theo ch? d?n c?a chuyn gia ch?m [place] s?c kh?e.  N?u qu v? ???c k thu?c khng sinh, hy s? d?ng thu?c theo ch? d?n c?a chuyn gia ch?m [place] s?c kh?e. Khng d?ng s? d?ng thu?c khng sinh ngay c? khi qu v? b?t ??u c?m th?y ?? h?n. Ch?m [place] v?t th??ng Tun th? ch? ??n c?a chuyn gia ch?m [employer] s?c kh?e v? cch ch?m [place] v?t th??ng. ??m b?o qu v?:  R?a tay b?ng x phng v n??c tr??c v sau khi qu v? thay b?ng (b?ng g?c). N?u khng c x phng v n??c, hy dng thu?c st trng tay.  Thay b?ng v v?t li?u b theo ch? d?n c?a chuyn gia ch?m [place] s?c kh?e. ? N?u b?ng b? kh ho?c b? m?c khi qu v? th? tho ra, hy lm ?m ho?c lm ??t b?ng b?ng n??c mu?i sinh l ho?c n??c ?? c th? tho b?ng m khng gy t?n h?i cho da ho?c m. ? N?u v?t th??ng c?a qu v? ???c b l?i, hy ?? yn cho ??n khi chuyn gia ch?m [place] s?c kh?e yu c?u qu v? tho ra. ?? tho v?t li?u b, dng n??c mu?i sinh l ho?c n??c ?? lm ?m ho?c lm ??t v?t li?u b sao cho c th? tho ra m khng gy t?n th??ng cho da ho?c m.  ?? nguyn cc m?i khu (ch? ph?u thu?t), keo dn da ho?c cc d?i b?ng dnh t?i ch?. Nh?ng th? dng ?? ?ng v?t m? trn da ny c th? c?n ph?i ?? nguyn t?i ch? trong 2 tu?n ho?c lu h?n. N?u cc mp d?i b?ng dnh b?t ??u long ra v cong ln, qu v? c th? c?t nh?ng ch? mp b? long ra ?Imagene Sheller bc h?t cc d?i b?ng dnh ra tr? khi chuyn gia ch?m [place] s?c kh?e b?o qu v? lm nh? v?y. Ki?m tra v?t th??ng m?i ngy xem c cc d?u hi?u nhi?m trng hay khng. Ki?m tra xem c:  ??, s?ng n?, ho?c ?au nhi?u h?n.  Nhi?u d?ch ho?c mu h?n.  ?m.  M? ho?c mi hi. N?u qu v? ???c cho v? nh cng v?i ?ng d?n l?u trn ng??i, hy lm theo h??ng d?n t? chuyn gia ch?m [place] s?c kh?e  v?:  Cch ?? s?ch d?ch.  Cch ch?m [place] ?ng t?i nh.  H??ng d?n chung  ?? cho vng b? ?nh h??ng ngh? ng?i.  Khng t?m b?n, b?i, ho?c s? d?ng b?n t?m n??c nng cho ??n khi ???c chuyn gia ch?m [place] s?c kh?e c?a qu v? ch?p thu?n. Hy h?i chuyn gia ch?m [place] s?c kh?e xem qu v? c th? t?m vi hoa sen hay khng. Qu v? c th? ch? ???c php t?m b?ng mi?ng b?t bi?n.  Tr? l?i sinh ho?t bnh th??ng theo ch? d?n c?a chuyn gia ch?m [place] s?c kh?e. Hy h?i chuyn gia ch?m [place] s?c kh?e v? cc ho?t ??ng no l an ton cho qu v?. Chuyn gia ch?m [place] s?c kh?e c th? yu c?u qu v? h?n ch? ho?t ??ng ho?c h?n ch? nng nh?c.  V?t m? s? ti?p t?c r? n??c. Vi?c c m?t t d?ch trong ho?c l?n cht mu l bnh th??ng. L??ng d?ch c?n gi?m m?i ngy.  Khng bi b?t k? lo?i kem, thu?c m?, ho?c ch?t l?ng no tr? khi chuyn gia ch?m  s?c kh?e yu c?u qu v? lm nh? v?y.  Tun th? t?t c? cc l?n khm theo di theo ch? d?n c?a [place]  gia ch?m [place] s?c kh?e. ?i?u ny c vai tr quan tr?ng. Hy lin l?c v?i chuyn gia ch?m [place] s?c kh?e n?u:  U nang hay p xe cu?a quy? vi? xu?t hi?n la?i.  Qu v? b? s?t ho?c ?n l?nh.  Qu v? b? t?y ??, s?ng ho?c ?au nhi?u h?n xung quanh v?t m?.  Qu v? b? ch?y nhi?u d?ch ho?c mu h?n ? v?t m?.  C?m th?y ?m khi s? vo v?t m? c?a qu v?.  Qu v? th?y m? ho?c mi hi thot ra ? v?t m?.  Qu v? c cc v?t s?c mu ?? ? trn ho?c ? d??i v? tr v?t m?. Yu c?u tr? gip ngay l?p t?c n?u:  Quy? vi? ?au d? d?i ho?c ch?y mu.  Qu v? khng th? ?n ho?c u?ng v vi?c ny khi?n qu v? b? nn.  Quy? vi? bi? gi?m l??ng n??c ti?u.  Qu v? th?y kh th?.  Qu v? b? ?au ng?c.  Qu v? ho ra mu.  Vng b? ?nh h??ng tr? nn t b ho?c b?t ??u ?au bu?t. Nh?ng tri?u ch?ng ny c th? l bi?u hi?n c?a m?t v?n ?? nghim tr?ng c?n c?p c?u. Khng ch? xem tri?u ch?ng c h?t khng. Hy ?i khm ngay l?p t?c. G?i cho d?ch v? c?p c?u t?i ??a ph??ng (911 ? [place]?). Khng t? li xe ??n b?nh vi?n. Tm  t?t  Sau th? thu?t ny, th??ng c d?ch, mu, ho?c m? ch?y ra ? v? tr ph?u thu?t.  Tun th? t?t c? cc h??ng d?n ch?m [place] t?i nh. Qu v? s? ???c cho bi?t cch ch?m [place] v?t m?, cch ki?m tra nhi?m trng v cch dng thu?c.  N?u qu v? ???c k thu?c khng sinh, hy dng thu?c theo ch? d?n c?a chuyn gia ch?m [place] s?c kh?e. Khng d?ng s? d?ng thu?c khng sinh ngay c? khi qu v? b?t ??u c?m th?y ?? h?n.  Lin h? v?i chuyn gia ch?m [place] s?c kh?e n?u qu v? b? t?y ??, s?ng ho?c ?au t?ng ln quanh v?t m?. Tm s? tr? gip ngay l?p t?c n?u qu v? b? ?au ng?c, nn, ho ra mu ho?c kh th?.  Tun th? t?t c? cc l?n khm theo di theo ch? d?n c?a chuyn gia ch?m [place] s?c kh?e. ?i?u ny c vai tr quan tr?ng. Thng tin ny khng nh?m m?c ?ch thay th? cho l?i khuyn m chuyn gia ch?m [place] s?c kh?e ni v?i qu v?. Hy b?o ??m qu v? ph?i th?o lu?n b?t k? v?n ?? g m qu v? c v?i chuyn gia ch?m [place] s?c kh?e c?a qu v?. Document Released: 03/11/2016 Document Revised: 12/03/2018 Document Reviewed: 12/03/2018 Elsevier Patient Education  2020 Elsevier Inc.  Hypertension, Adult High blood pressure (hypertension) is when the force of blood pumping through the arteries is too strong. The arteries are the blood vessels that carry blood from the heart throughout the body. Hypertension forces the heart to work harder to pump blood and may cause arteries to become narrow or stiff. Untreated or uncontrolled hypertension can cause a heart attack, heart failure, a stroke, kidney disease, and other problems. A blood pressure reading consists of a higher number over a lower number. Ideally, your blood pressure should be below 120/80. The first ("top") number is called the systolic pressure. It is a measure of the pressure in your arteries as your heart beats. The second ("bottom") number is called the diastolic pressure. It is a measure of the  pressure in your arteries as the heart relaxes. What are the causes? The exact  cause of this condition is not known. There are some conditions that result in or are related to high blood pressure. What increases the risk? Some risk factors for high blood pressure are under your control. The following factors may make you more likely to develop this condition:  Smoking.  Having type 2 diabetes mellitus, high cholesterol, or both.  Not getting enough exercise or physical activity.  Being overweight.  Having too much fat, sugar, calories, or salt (sodium) in your diet.  Drinking too much alcohol. Some risk factors for high blood pressure may be difficult or impossible to change. Some of these factors include:  Having chronic kidney disease.  Having a family history of high blood pressure.  Age. Risk increases with age.  Race. You may be at higher risk if you are African American.  Gender. Men are at higher risk than women before age 63. After age 12, women are at higher risk than men.  Having obstructive sleep apnea.  Stress. What are the signs or symptoms? High blood pressure may not cause symptoms. Very high blood pressure (hypertensive crisis) may cause:  Headache.  Anxiety.  Shortness of breath.  Nosebleed.  Nausea and vomiting.  Vision changes.  Severe chest pain.  Seizures. How is this diagnosed? This condition is diagnosed by measuring your blood pressure while you are seated, with your arm resting on a flat surface, your legs uncrossed, and your feet flat on the floor. The cuff of the blood pressure monitor will be placed directly against the skin of your upper arm at the level of your heart. It should be measured at least twice using the same arm. Certain conditions can cause a difference in blood pressure between your right and left arms. Certain factors can cause blood pressure readings to be lower or higher than normal for a short period of time:  When your blood pressure is higher when you are in a health care provider's office than  when you are at home, this is called white coat hypertension. Most people with this condition do not need medicines.  When your blood pressure is higher at home than when you are in a health care provider's office, this is called masked hypertension. Most people with this condition may need medicines to control blood pressure. If you have a high blood pressure reading during one visit or you have normal blood pressure with other risk factors, you may be asked to:  Return on a different day to have your blood pressure checked again.  Monitor your blood pressure at home for 1 week or longer. If you are diagnosed with hypertension, you may have other blood or imaging tests to help your health care provider understand your overall risk for other conditions. How is this treated? This condition is treated by making healthy lifestyle changes, such as eating healthy foods, exercising more, and reducing your alcohol intake. Your health care provider may prescribe medicine if lifestyle changes are not enough to get your blood pressure under control, and if:  Your systolic blood pressure is above 130.  Your diastolic blood pressure is above 80. Your personal target blood pressure may vary depending on your medical conditions, your age, and other factors. Follow these instructions at home: Eating and drinking   Eat a diet that is high in fiber and potassium, and low in sodium, added sugar, and fat. An example eating plan is called  the DASH (Dietary Approaches to Stop Hypertension) diet. To eat this way: ? Eat plenty of fresh fruits and vegetables. Try to fill one half of your plate at each meal with fruits and vegetables. ? Eat whole grains, such as whole-wheat pasta, brown rice, or whole-grain bread. Fill about one fourth of your plate with whole grains. ? Eat or drink low-fat dairy products, such as skim milk or low-fat yogurt. ? Avoid fatty cuts of meat, processed or cured meats, and poultry with  skin. Fill about one fourth of your plate with lean proteins, such as fish, chicken without skin, beans, eggs, or tofu. ? Avoid pre-made and processed foods. These tend to be higher in sodium, added sugar, and fat.  Reduce your daily sodium intake. Most people with hypertension should eat less than 1,500 mg of sodium a day.  Do not drink alcohol if: ? Your health care provider tells you not to drink. ? You are pregnant, may be pregnant, or are planning to become pregnant.  If you drink alcohol: ? Limit how much you use to:  0-1 drink a day for women.  0-2 drinks a day for men. ? Be aware of how much alcohol is in your drink. In the [place] one drink equals one 12 oz bottle of beer (355 mL), one 5 oz glass of wine (148 mL), or one 1 oz glass of hard liquor (44 mL). Lifestyle   Work with your health care provider to maintain a healthy body weight or to lose weight. Ask what an ideal weight is for you.  Get at least 30 minutes of exercise most days of the week. Activities may include walking, swimming, or biking.  Include exercise to strengthen your muscles (resistance exercise), such as Pilates or lifting weights, as part of your weekly exercise routine. Try to do these types of exercises for 30 minutes at least 3 days a week.  Do not use any products that contain nicotine or tobacco, such as cigarettes, e-cigarettes, and chewing tobacco. If you need help quitting, ask your health care provider.  Monitor your blood pressure at home as told by your health care provider.  Keep all follow-up visits as told by your health care provider. This is important. Medicines  Take over-the-counter and prescription medicines only as told by your health care provider. Follow directions carefully. Blood pressure medicines must be taken as prescribed.  Do not skip doses of blood pressure medicine. Doing this puts you at risk for problems and can make the medicine less effective.  Ask your health care  provider about side effects or reactions to medicines that you should watch for. Contact a health care provider if you:  Think you are having a reaction to a medicine you are taking.  Have headaches that keep coming back (recurring).  Feel dizzy.  Have swelling in your ankles.  Have trouble with your vision. Get help right away if you:  Develop a severe headache or confusion.  Have unusual weakness or numbness.  Feel faint.  Have severe pain in your chest or abdomen.  Vomit repeatedly.  Have trouble breathing. Summary  Hypertension is when the force of blood pumping through your arteries is too strong. If this condition is not controlled, it may put you at risk for serious complications.  Your personal target blood pressure may vary depending on your medical conditions, your age, and other factors. For most people, a normal blood pressure is less than 120/80.  Hypertension is treated with lifestyle  changes, medicines, or a combination of both. Lifestyle changes include losing weight, eating a healthy, low-sodium diet, exercising more, and limiting alcohol. This information is not intended to replace advice given to you by your health care provider. Make sure you discuss any questions you have with your health care provider. Document Released: 11/18/2005 Document Revised: 07/29/2018 Document Reviewed: 07/29/2018 Elsevier Patient Education  2020 Elsevier Inc.      Edwina BarthMiguel Aija Scarfo, MD [facility]

## 2019-09-27 NOTE — Patient Instructions (Addendum)
If you have lab work done today you will be contacted with your lab results within the next 2 weeks.  If you have not heard from [place] then please contact us. The fastest way to get your results is to register for My Chart.   IF you received an x-ray today, you will receive an invoice from [facility]. Please contact [facility] at [phone] with questions or concerns regarding your invoice.   IF you received labwork today, you will receive an invoice from [facility]. Please contact [facility] at [phone] with questions or concerns regarding your invoice.   Our billing staff will not be able to assist you with questions regarding bills from these companies.  You will be contacted with the lab results as soon as they are available. The fastest way to get your results is to activate your My Chart account. Instructions are located on the last page of this paperwork. If you have not heard from [place] regarding the results in 2 weeks, please contact this office.     V?t m? va? d?n l?u, Ch?m [place] sau thu? thu?t Incision and Drainage, Care After T? thng tin ny cung c?p cho qu v? thng tin v? cch t? ch?m [place] b?n thn sau khi lm th? thu?t. Chuyn gia ch?m [place] s?c kh?e c?ng c th? c h??ng d?n c? th? h?n cho qu v?. Hy lin l?c v?i chuyn gia ch?m [place] s?c kh?e n?u qu v? c cc v?n ?? ho?c th?c m?c. Ti c th? d? ki?n ?i?u g s? x?y ra sau khi lm th? thu?t? Sau khi lm th? thu?t th??ng b?:  ?au ho?c c?m gic kh ch?u quanh v?t m?.  Mu, ch?t l?ng ho?c m? (d?ch d?n l?u) ch?y ra ? v?t m?.  Da t?y ?? v c?ng quanh v? tr v?t m?. Tun th? nh?ng h??ng d?n ny ? nh: Thu?c  Ch? s? d?ng thu?c khng k ??n v thu?c k ??n theo ch? d?n c?a chuyn gia ch?m [place] s?c kh?e.  N?u qu v? ???c k thu?c khng sinh, hy s? d?ng thu?c theo ch? d?n c?a chuyn gia ch?m [place] s?c kh?e. Khng d?ng s? d?ng thu?c khng sinh ngay c? khi qu v? b?t ??u c?m th?y ?? h?n. Ch?m [place] v?t th??ng Tun  th? ch? ??n c?a chuyn gia ch?m [place] s?c kh?e v? cch ch?m [place] v?t th??ng. ??m b?o qu v?:  R?a tay b?ng x phng v n??c tr??c v sau khi qu v? thay b?ng (b?ng g?c). N?u khng c x phng v n??c, hy dng thu?c st trng tay.  Thay b?ng v v?t li?u b theo ch? d?n c?a chuyn gia ch?m [place] s?c kh?e. ? N?u b?ng b? kh ho?c b? m?c khi qu v? th? tho ra, hy lm ?m ho?c lm ??t b?ng b?ng n??c mu?i sinh l ho?c n??c ?? c th? tho b?ng m khng gy t?n h?i cho da ho?c m. ? N?u v?t th??ng c?a qu v? ???c b l?i, hy ?? yn cho ??n khi chuyn gia ch?m [place] s?c kh?e yu c?u qu v? tho ra. ?? tho v?t li?u b, dng n??c mu?i sinh l ho?c n??c ?? lm ?m ho?c lm ??t v?t li?u b sao cho c th? tho ra m khng gy t?n th??ng cho da ho?c m.  ?? nguyn cc m?i khu (ch? ph?u thu?t), keo dn da ho?c cc d?i b?ng dnh t?i ch?. Nh?ng th? dng ?? ?ng v?t m? trn da ny c th? c?n ph?i ??  nguyn t?i ch? trong 2 tu?n ho?c lu h?n. N?u cc mp d?i b?ng dnh b?t ??u long ra v cong ln, qu v? c th? c?t nh?ng ch? mp b? long ra ?Maggie Schwalbe bc h?t cc d?i b?ng dnh ra tr? khi chuyn gia ch?m [place] s?c kh?e b?o qu v? lm nh? v?y. Ki?m tra v?t th??ng m?i ngy xem c cc d?u hi?u nhi?m trng hay khng. Ki?m tra xem c:  ??, s?ng n?, ho?c ?au nhi?u h?n.  Nhi?u d?ch ho?c mu h?n.  ?m.  M? ho?c mi hi. N?u qu v? ???c cho v? nh cng v?i ?ng d?n l?u trn ng??i, hy lm theo h??ng d?n t? chuyn gia ch?m [place] s?c kh?e v?:  Cch ?? s?ch d?ch.  Cch ch?m [place] ?ng t?i nh.  H??ng d?n chung  ?? cho vng b? ?nh h??ng ngh? ng?i.  Khng t?m b?n, b?i, ho?c s? d?ng b?n t?m n??c nng cho ??n khi ???c chuyn gia ch?m [place] s?c kh?e c?a qu v? ch?p thu?n. Hy h?i chuyn gia ch?m [place] s?c kh?e xem qu v? c th? t?m vi hoa sen hay khng. Qu v? c th? ch? ???c php t?m b?ng mi?ng b?t bi?n.  Tr? l?i sinh ho?t bnh th??ng theo ch? d?n c?a chuyn gia ch?m [place] s?c kh?e. Hy h?i chuyn gia ch?m [place] s?c kh?e v? cc ho?t ??ng no l an  ton cho qu v?. Chuyn gia ch?m [place] s?c kh?e c th? yu c?u qu v? h?n ch? ho?t ??ng ho?c h?n ch? nng nh?c.  V?t m? s? ti?p t?c r? n??c. Vi?c c m?t t d?ch trong ho?c l?n cht mu l bnh th??ng. L??ng d?ch c?n gi?m m?i ngy.  Khng bi b?t k? lo?i kem, thu?c m?, ho?c ch?t l?ng no tr? khi chuyn gia ch?m [place] s?c kh?e yu c?u qu v? lm nh? v?y.  Tun th? t?t c? cc l?n khm theo di theo ch? d?n c?a chuyn gia ch?m [place] s?c kh?e. ?i?u ny c vai tr quan tr?ng. Hy lin l?c v?i chuyn gia ch?m [place] s?c kh?e n?u:  U nang hay p xe cu?a quy? vi? xu?t hi?n la?i.  Qu v? b? s?t ho?c ?n l?nh.  Qu v? b? t?y ??, s?ng ho?c ?au nhi?u h?n xung quanh v?t m?.  Qu v? b? ch?y nhi?u d?ch ho?c mu h?n ? v?t m?.  C?m th?y ?m khi s? vo v?t m? c?a qu v?.  Qu v? th?y m? ho?c mi hi thot ra ? v?t m?.  Qu v? c cc v?t s?c mu ?? ? trn ho?c ? d??i v? tr v?t m?. Yu c?u tr? gip ngay l?p t?c n?u:  Quy? vi? ?au d? d?i ho?c ch?y mu.  Qu v? khng th? ?n ho?c u?ng v vi?c ny khi?n qu v? b? nn.  Quy? vi? bi? gi?m l??ng n??c ti?u.  Qu v? th?y kh th?.  Qu v? b? ?au ng?c.  Qu v? ho ra mu.  Vng b? ?nh h??ng tr? nn t b ho?c b?t ??u ?au bu?t. Nh?ng tri?u ch?ng ny c th? l bi?u hi?n c?a m?t v?n ?? nghim tr?ng c?n c?p c?u. Khng ch? xem tri?u ch?ng c h?t khng. Hy ?i khm ngay l?p t?c. G?i cho d?ch v? c?p c?u t?i ??a ph??ng (911 ? [place] Khng t? li xe ??n b?nh vi?n. Tm t?t  Sau th? thu?t ny, th??ng c d?ch, mu, ho?c m? ch?y ra ? v? tr ph?u thu?t.  Tun th? t?t c? cc h??ng  d?n ch?m [place] t?i nh. Qu v? s? ???c cho bi?t cch ch?m [place] v?t m?, cch ki?m tra nhi?m trng v cch dng thu?c.  N?u qu v? ???c k thu?c khng sinh, hy dng thu?c theo ch? d?n c?a chuyn gia ch?m [place] s?c kh?e. Khng d?ng s? d?ng thu?c khng sinh ngay c? khi qu v? b?t ??u c?m th?y ?? h?n.  Lin h? v?i chuyn gia ch?m [place] s?c kh?e n?u qu v? b? t?y ??, s?ng ho?c ?au t?ng ln quanh v?t m?. Tm  s? tr? gip ngay l?p t?c n?u qu v? b? ?au ng?c, nn, ho ra mu ho?c kh th?.  Tun th? t?t c? cc l?n khm theo di theo ch? d?n c?a chuyn gia ch?m [place] s?c kh?e. ?i?u ny c vai tr quan tr?ng. Thng tin ny khng nh?m m?c ?ch thay th? cho l?i khuyn m chuyn gia ch?m [place] s?c kh?e ni v?i qu v?. Hy b?o ??m qu v? ph?i th?o lu?n b?t k? v?n ?? g m qu v? c v?i chuyn gia ch?m  s?c kh?e c?a qu v?. Document Released: 03/11/2016 Document Revised: 12/03/2018 Document Reviewed: 12/03/2018 Elsevier Patient Education  2020 Elsevier Inc.  Hypertension, Adult High blood pressure (hypertension) is when the force of blood pumping through the arteries is too strong. The arteries are the blood vessels that carry blood from the heart throughout the body. Hypertension forces the heart to work harder to pump blood and may cause arteries to become narrow or stiff. Untreated or uncontrolled hypertension can cause a heart attack, heart failure, a stroke, kidney disease, and other problems. A blood pressure reading consists of a higher number over a lower number. Ideally, your blood pressure should be below 120/80. The first ("top") number is called the systolic pressure. It is a measure of the pressure in your arteries as your heart beats. The second ("bottom") number is called the diastolic pressure. It is a measure of the pressure in your arteries as the heart relaxes. What are the causes? The exact cause of this condition is not known. There are some conditions that result in or are related to high blood pressure. What increases the risk? Some risk factors for high blood pressure are under your control. The following factors may make you more likely to develop this condition:  Smoking.  Having type 2 diabetes mellitus, high cholesterol, or both.  Not getting enough exercise or physical activity.  Being overweight.  Having too much fat, sugar, calories, or salt (sodium) in your diet.  Drinking  too much alcohol. Some risk factors for high blood pressure may be difficult or impossible to change. Some of these factors include:  Having chronic kidney disease.  Having a family history of high blood pressure.  Age. Risk increases with age.  Race. You may be at higher risk if you are African American.  Gender. Men are at higher risk than women before age 68. After age 9, women are at higher risk than men.  Having obstructive sleep apnea.  Stress. What are the signs or symptoms? High blood pressure may not cause symptoms. Very high blood pressure (hypertensive crisis) may cause:  Headache.  Anxiety.  Shortness of breath.  Nosebleed.  Nausea and vomiting.  Vision changes.  Severe chest pain.  Seizures. How is this diagnosed? This condition is diagnosed by measuring your blood pressure while you are seated, with your arm resting on a flat surface, your legs uncrossed, and your feet flat on the floor. The cuff  of the blood pressure monitor will be placed directly against the skin of your upper arm at the level of your heart. It should be measured at least twice using the same arm. Certain conditions can cause a difference in blood pressure between your right and left arms. Certain factors can cause blood pressure readings to be lower or higher than normal for a short period of time:  When your blood pressure is higher when you are in a health care provider's office than when you are at home, this is called white coat hypertension. Most people with this condition do not need medicines.  When your blood pressure is higher at home than when you are in a health care provider's office, this is called masked hypertension. Most people with this condition may need medicines to control blood pressure. If you have a high blood pressure reading during one visit or you have normal blood pressure with other risk factors, you may be asked to:  Return on a different day to have your  blood pressure checked again.  Monitor your blood pressure at home for 1 week or longer. If you are diagnosed with hypertension, you may have other blood or imaging tests to help your health care provider understand your overall risk for other conditions. How is this treated? This condition is treated by making healthy lifestyle changes, such as eating healthy foods, exercising more, and reducing your alcohol intake. Your health care provider may prescribe medicine if lifestyle changes are not enough to get your blood pressure under control, and if:  Your systolic blood pressure is above 130.  Your diastolic blood pressure is above 80. Your personal target blood pressure may vary depending on your medical conditions, your age, and other factors. Follow these instructions at home: Eating and drinking   Eat a diet that is high in fiber and potassium, and low in sodium, added sugar, and fat. An example eating plan is called the DASH (Dietary Approaches to Stop Hypertension) diet. To eat this way: ? Eat plenty of fresh fruits and vegetables. Try to fill one half of your plate at each meal with fruits and vegetables. ? Eat whole grains, such as whole-wheat pasta, brown rice, or whole-grain bread. Fill about one fourth of your plate with whole grains. ? Eat or drink low-fat dairy products, such as skim milk or low-fat yogurt. ? Avoid fatty cuts of meat, processed or cured meats, and poultry with skin. Fill about one fourth of your plate with lean proteins, such as fish, chicken without skin, beans, eggs, or tofu. ? Avoid pre-made and processed foods. These tend to be higher in sodium, added sugar, and fat.  Reduce your daily sodium intake. Most people with hypertension should eat less than 1,500 mg of sodium a day.  Do not drink alcohol if: ? Your health care provider tells you not to drink. ? You are pregnant, may be pregnant, or are planning to become pregnant.  If you drink alcohol: ? Limit  how much you use to:  0-1 drink a day for women.  0-2 drinks a day for men. ? Be aware of how much alcohol is in your drink. In the [place] one drink equals one 12 oz bottle of beer (355 mL), one 5 oz glass of wine (148 mL), or one 1 oz glass of hard liquor (44 mL). Lifestyle   Work with your health care provider to maintain a healthy body weight or to lose weight. Ask what an ideal weight  is for you.  Get at least 30 minutes of exercise most days of the week. Activities may include walking, swimming, or biking.  Include exercise to strengthen your muscles (resistance exercise), such as Pilates or lifting weights, as part of your weekly exercise routine. Try to do these types of exercises for 30 minutes at least 3 days a week.  Do not use any products that contain nicotine or tobacco, such as cigarettes, e-cigarettes, and chewing tobacco. If you need help quitting, ask your health care provider.  Monitor your blood pressure at home as told by your health care provider.  Keep all follow-up visits as told by your health care provider. This is important. Medicines  Take over-the-counter and prescription medicines only as told by your health care provider. Follow directions carefully. Blood pressure medicines must be taken as prescribed.  Do not skip doses of blood pressure medicine. Doing this puts you at risk for problems and can make the medicine less effective.  Ask your health care provider about side effects or reactions to medicines that you should watch for. Contact a health care provider if you:  Think you are having a reaction to a medicine you are taking.  Have headaches that keep coming back (recurring).  Feel dizzy.  Have swelling in your ankles.  Have trouble with your vision. Get help right away if you:  Develop a severe headache or confusion.  Have unusual weakness or numbness.  Feel faint.  Have severe pain in your chest or abdomen.  Vomit  repeatedly.  Have trouble breathing. Summary  Hypertension is when the force of blood pumping through your arteries is too strong. If this condition is not controlled, it may put you at risk for serious complications.  Your personal target blood pressure may vary depending on your medical conditions, your age, and other factors. For most people, a normal blood pressure is less than 120/80.  Hypertension is treated with lifestyle changes, medicines, or a combination of both. Lifestyle changes include losing weight, eating a healthy, low-sodium diet, exercising more, and limiting alcohol. This information is not intended to replace advice given to you by your health care provider. Make sure you discuss any questions you have with your health care provider. Document Released: 11/18/2005 Document Revised: 07/29/2018 Document Reviewed: 07/29/2018 Elsevier Patient Education  2020 [employer].

## 2019-11-29 ENCOUNTER — Encounter: Payer: Self-pay | Admitting: Emergency Medicine

## 2019-11-29 ENCOUNTER — Telehealth: Payer: BC Managed Care – PPO | Admitting: Emergency Medicine

## 2019-11-30 ENCOUNTER — Encounter: Payer: Self-pay | Admitting: Emergency Medicine

## 2019-12-01 ENCOUNTER — Other Ambulatory Visit: Payer: Self-pay

## 2019-12-01 ENCOUNTER — Telehealth (INDEPENDENT_AMBULATORY_CARE_PROVIDER_SITE_OTHER): Payer: BC Managed Care – PPO | Admitting: Emergency Medicine

## 2019-12-01 ENCOUNTER — Encounter: Payer: Self-pay | Admitting: Emergency Medicine

## 2019-12-01 VITALS — Ht 61.0 in | Wt 141.0 lb

## 2019-12-01 DIAGNOSIS — Z20828 Contact with and (suspected) exposure to other viral communicable diseases: Secondary | ICD-10-CM | POA: Diagnosis not present

## 2019-12-01 DIAGNOSIS — R6889 Other general symptoms and signs: Secondary | ICD-10-CM

## 2019-12-01 DIAGNOSIS — J029 Acute pharyngitis, unspecified: Secondary | ICD-10-CM | POA: Diagnosis not present

## 2019-12-01 DIAGNOSIS — Z20822 Contact with and (suspected) exposure to covid-19: Secondary | ICD-10-CM

## 2019-12-01 MED ORDER — AZITHROMYCIN 250 MG PO TABS: ORAL_TABLET | ORAL | 0 refills | Status: DC

## 2019-12-01 NOTE — Progress Notes (Signed)
Telemedicine Encounter- SOAP NOTE Established Patient  This telephone encounter was conducted with the patient's (or proxy's) verbal consent via audio telecommunications: yes/no: Yes Patient was instructed to have this encounter in a suitably private space; and to only have persons present to whom they give permission to participate. In addition, patient identity was confirmed by use of name plus two identifiers (DOB and address).  I discussed the limitations, risks, security and privacy concerns of performing an evaluation and management service by telephone and the availability of in person appointments. I also discussed with the patient that there may be a patient responsible charge related to this service. The patient expressed understanding and agreed to proceed.  I spent a total of TIME; 0 MIN TO 60 MIN: 15 minutes talking with the patient or their proxy.  Chief Complaint  Patient presents with  . Sore Throat    since 11/25/2019 and it is very sore, wants to be tested for COVID  . Cough    sometimes yellow mucus and body ache    Subjective   Pamela Stanley is a 43 y.o. female established patient. Telephone visit today complaining of flulike symptoms since 11/25/2019 with sore throat, chills, cough and muscle aches.  Husband was also sick first.  Denies difficulty breathing, chest pain, or wheezing.  Able to eat and drink.  Denies difficulty swallowing, nausea or vomiting.  Denies abdominal pain or diarrhea.  Denies loss of smell or taste.  Denies rash.  No other significant symptoms. History obtained using [place] interpreter [id number], translating [place].  HPI   Patient Active Problem List   Diagnosis Date Noted  . Language barrier 01/03/2013    Past Medical History:  Diagnosis Date  . Gallstones   . GERD (gastroesophageal reflux disease)   . Ulcer     Current Outpatient Medications  Medication Sig Dispense Refill  . Acetaminophen (TYLENOL PO) Take by mouth.    .  Pseudoeph-Doxylamine-DM-APAP (NYQUIL PO) Take by mouth daily.    Marland Kitchen azithromycin (ZITHROMAX) 250 MG tablet Sig as indicated 6 tablet 0  . naproxen (NAPROSYN) 375 MG tablet Take 1 tablet (375 mg total) by mouth 2 (two) times daily. (Patient not taking: Reported on 12/01/2019) 20 tablet 0   No current facility-administered medications for this visit.    No Known Allergies  Social History   Socioeconomic History  . Marital status: Married    Spouse name: Not on file  . Number of children: 0  . Years of education: Not on file  . Highest education level: Not on file  Occupational History  . Not on file  Tobacco Use  . Smoking status: Never Smoker  . Smokeless tobacco: Never Used  Substance and Sexual Activity  . Alcohol use: No    Alcohol/week: 0.0 standard drinks  . Drug use: No  . Sexual activity: Yes    Birth control/protection: None  Other Topics Concern  . Not on file  Social History Narrative  . Not on file   Social Determinants of Health   Financial Resource Strain:   . Difficulty of Paying Living Expenses: Not on file  Food Insecurity:   . Worried About Charity fundraiser in the Last Year: Not on file  . Ran Out of Food in the Last Year: Not on file  Transportation Needs:   . Lack of Transportation (Medical): Not on file  . Lack of Transportation (Non-Medical): Not on file  Physical Activity:   . Days of Exercise per  Week: Not on file  . Minutes of Exercise per Session: Not on file  Stress:   . Feeling of Stress : Not on file  Social Connections:   . Frequency of Communication with Friends and Family: Not on file  . Frequency of Social Gatherings with Friends and Family: Not on file  . Attends Religious Services: Not on file  . Active Member of Clubs or Organizations: Not on file  . Attends Banker Meetings: Not on file  . Marital Status: Not on file  Intimate Partner Violence:   . Fear of Current or Ex-Partner: Not on file  . Emotionally  Abused: Not on file  . Physically Abused: Not on file  . Sexually Abused: Not on file    Review of Systems  Constitutional: Positive for chills. Negative for fever.  HENT: Positive for sore throat. Negative for congestion.   Respiratory: Positive for cough.   Cardiovascular: Negative.  Negative for chest pain and palpitations.  Gastrointestinal: Negative.  Negative for abdominal pain, blood in stool, diarrhea, melena, nausea and vomiting.  Genitourinary: Negative.  Negative for dysuria and hematuria.  Musculoskeletal: Positive for myalgias.  Skin: Negative.   Neurological: Negative.  Negative for dizziness and headaches.  Endo/Heme/Allergies: Negative.   All other systems reviewed and are negative.   Objective  Alert and oriented x3 in no apparent respiratory distress. vitals as reported by the patient: Today's Vitals   12/01/19 1644  Weight: 141 lb (64 kg)  Height: 5\' 1"  (1.549 m)    Pamela Stanley was seen today for sore throat and cough.  Diagnoses and all orders for this visit:  Sore throat -     azithromycin (ZITHROMAX) 250 MG tablet; Sig as indicated  Flu-like symptoms  Suspected COVID-19 virus infection  Clinically stable.  No red flag signs or symptoms.  Suspected Covid infection.  Covid instructions given.  ED precautions given.  Advised to contact the office if no better or worse during the next several days.   I discussed the assessment and treatment plan with the patient. The patient was provided an opportunity to ask questions and all were answered. The patient agreed with the plan and demonstrated an understanding of the instructions.   The patient was advised to call back or seek an in-person evaluation if the symptoms worsen or if the condition fails to improve as anticipated.  I provided 15 minutes of non-face-to-face time during this encounter.  , MD  Primary Care at [facility]

## 2019-12-02 ENCOUNTER — Ambulatory Visit: Payer: BC Managed Care – PPO | Attending: Internal Medicine

## 2019-12-02 DIAGNOSIS — U071 COVID-19: Secondary | ICD-10-CM

## 2019-12-02 DIAGNOSIS — R238 Other skin changes: Secondary | ICD-10-CM

## 2019-12-03 LAB — NOVEL CORONAVIRUS, NAA: SARS-CoV-2, NAA: DETECTED — AB

## 2019-12-06 ENCOUNTER — Encounter (HOSPITAL_COMMUNITY): Payer: Self-pay | Admitting: *Deleted

## 2019-12-06 ENCOUNTER — Emergency Department (HOSPITAL_COMMUNITY)
Admission: EM | Admit: 2019-12-06 | Discharge: 2019-12-06 | Disposition: A | Discharge status: Home / Self Care | Payer: BC Managed Care – PPO | Attending: Emergency Medicine | Admitting: Emergency Medicine

## 2019-12-06 ENCOUNTER — Other Ambulatory Visit: Payer: Self-pay

## 2019-12-06 DIAGNOSIS — R030 Elevated blood-pressure reading, without diagnosis of hypertension: Secondary | ICD-10-CM | POA: Insufficient documentation

## 2019-12-06 DIAGNOSIS — Z79899 Other long term (current) drug therapy: Secondary | ICD-10-CM | POA: Insufficient documentation

## 2019-12-06 DIAGNOSIS — R05 Cough: Secondary | ICD-10-CM | POA: Diagnosis not present

## 2019-12-06 DIAGNOSIS — U071 COVID-19: Secondary | ICD-10-CM | POA: Insufficient documentation

## 2019-12-06 LAB — BASIC METABOLIC PANEL
Anion gap: 8 (ref 5–15)
BUN: 6 mg/dL (ref 6–20)
CO2: 24 mmol/L (ref 22–32)
Calcium: 9.4 mg/dL (ref 8.9–10.3)
Chloride: 106 mmol/L (ref 98–111)
Creatinine, Ser: 0.58 mg/dL (ref 0.44–1.00)
GFR calc Af Amer: >60 mL/min (ref >60)
GFR calc non Af Amer: >60 mL/min (ref >60)
Glucose, Bld: 111 mg/dL — ABNORMAL HIGH (ref 70–99)
Potassium: 3.9 mmol/L (ref 3.5–5.1)
Sodium: 138 mmol/L (ref 135–145)

## 2019-12-06 LAB — CBC
HCT: 40.6 % (ref 36.0–46.0)
Hemoglobin: 13.7 g/dL (ref 12.0–15.0)
MCH: 29.7 pg (ref 26.0–34.0)
MCHC: 33.7 g/dL (ref 30.0–36.0)
MCV: 88.1 fL (ref 80.0–100.0)
Platelets: 257 10*3/uL (ref 150–400)
RBC: 4.61 MIL/uL (ref 3.87–5.11)
RDW: 11.9 % (ref 11.5–15.5)
WBC: 6.8 10*3/uL (ref 4.0–10.5)
nRBC: 0 % (ref 0.0–0.2)

## 2019-12-06 MED ORDER — ACETAMINOPHEN 500 MG PO TABS
1000.0000 mg | ORAL_TABLET | Freq: Once | ORAL | Status: AC
  Administered 2019-12-06: 14:00:00 1000 mg via ORAL
  Filled 2019-12-06: qty 2

## 2019-12-06 NOTE — ED Notes (Signed)
Pt encouraged to drink, water at bedside

## 2019-12-06 NOTE — ED Notes (Signed)
Patient verbalizes understanding of discharge instructions, [place]et[place] interpreter used. Opportunity for questioning and answers were provided. Pt discharged from ED.

## 2019-12-06 NOTE — Discharge Instructions (Addendum)
It was our pleasure to provide your ER care today - we hope that you feel better.  See COVID instructions/precautions.  Rest. Drink plenty of fluids. Take acetaminophen or ibuprofen as need for fever and body aches.   For blood pressure - see blood pressure and DASH instructions, limit salt intake, and follow up with primary care doctor in 2-3 weeks for recheck.   Return to ER if worse, new symptoms, increased trouble breathing, new or severe pain, numbness/weakness, or other concern.

## 2019-12-06 NOTE — ED Provider Notes (Signed)
[facility] EMERGENCY DEPARTMENT Provider Note   CSN: [id number] Arrival date & time: 12/06/19  1884     History Chief Complaint  Patient presents with  . Covid/HTN    Pamela Stanley is a 44 y.o. female.  Patient with recent covid + diagnosis, c/o concern about bp being elevated. States onset cough/congestion a week ago. On 12/31 had covid test positive. States recent check of bp was in range of 160/. Mild body aches, and had mild frontal headache earlier. No change in speech or vision. No numbness/weakness. No chest pain or sob. States feels cough is improved, and no fevers. No swelling.   The history is provided by the patient.       Past Medical History:  Diagnosis Date  . Gallstones   . GERD (gastroesophageal reflux disease)   . Ulcer     Patient Active Problem List   Diagnosis Date Noted  . Language barrier 01/03/2013    History reviewed. No pertinent surgical history.   OB History   No obstetric history on file.     Family History  Problem Relation Age of Onset  . Hyperlipidemia Mother   . Hypertension Mother   . Hypertension Father   . Hyperlipidemia Father     Social History   Tobacco Use  . Smoking status: Never Smoker  . Smokeless tobacco: Never Used  Substance Use Topics  . Alcohol use: No    Alcohol/week: 0.0 standard drinks  . Drug use: No    Home Medications Prior to Admission medications   Medication Sig Start Date End Date Taking? Authorizing Provider  Acetaminophen (TYLENOL PO) Take by mouth.    [provider]  azithromycin (ZITHROMAX) 250 MG tablet Sig as indicated 12/01/19   Georgina Quint, MD  naproxen (NAPROSYN) 375 MG tablet Take 1 tablet (375 mg total) by mouth 2 (two) times daily. Patient not taking: Reported on 12/01/2019 08/13/19   Wurst, [place], PA-C  Pseudoeph-Doxylamine-DM-APAP (NYQUIL PO) Take by mouth daily.    [provider]  dicyclomine (BENTYL) 20 MG tablet Take 1 tablet  (20 mg total) by mouth every 6 (six) hours as needed for spasms. 05/12/19 08/13/19  Georgina Quint, MD  pantoprazole (PROTONIX) 40 MG tablet Take 1 tablet by mouth daily. 06/17/19 08/13/19  [provider]    Allergies    Patient has no known allergies.  Review of Systems   Review of Systems  Constitutional: Negative for fever.  HENT: Negative for sore throat.   Eyes: Negative for visual disturbance.  Respiratory: Negative for shortness of breath.   Cardiovascular: Negative for chest pain.  Gastrointestinal: Negative for abdominal pain and vomiting.  Genitourinary: Negative for flank pain.  Musculoskeletal: Positive for myalgias. Negative for back pain.  Skin: Negative for rash.  Neurological: Negative for speech difficulty, weakness and numbness.  Hematological: Does not bruise/bleed easily.  Psychiatric/Behavioral: Negative for confusion.    Physical Exam Updated Vital Signs BP (!) 157/105 (BP Location: Left Arm)   Pulse 86   Temp 98 F (36.7 C) (Oral)   Resp 16   LMP 12/01/2019   SpO2 97%   Physical Exam Vitals and nursing note reviewed.  Constitutional:      Appearance: Normal appearance. She is well-developed.  HENT:     Head: Atraumatic.     Nose: Nose normal.     Mouth/Throat:     Mouth: Mucous membranes are moist.  Eyes:     General: No scleral icterus.  Extraocular Movements: Extraocular movements intact.     Conjunctiva/sclera: Conjunctivae normal.     Pupils: Pupils are equal, round, and reactive to light.  Neck:     Trachea: No tracheal deviation.  Cardiovascular:     Rate and Rhythm: Normal rate and regular rhythm.     Pulses: Normal pulses.     Heart sounds: Normal heart sounds. No murmur. No friction rub. No gallop.   Pulmonary:     Effort: Pulmonary effort is normal. No respiratory distress.     Breath sounds: Normal breath sounds.  Abdominal:     General: Bowel sounds are normal. There is no distension.     Palpations: Abdomen  is soft.     Tenderness: There is no abdominal tenderness.  Genitourinary:    Comments: No cva tenderness.  Musculoskeletal:        General: No swelling.     Cervical back: Normal range of motion and neck supple. No rigidity. No muscular tenderness.     Right lower leg: No edema.     Left lower leg: No edema.  Skin:    General: Skin is warm and dry.     Findings: No rash.  Neurological:     Mental Status: She is alert.     Comments: Alert, speech normal. Motor intact bil, stre 5/5. sens grossly intact bil. Steady gait.   Psychiatric:        Mood and Affect: Mood normal.     ED Results / Procedures / Treatments   Labs (all labs ordered are listed, but only abnormal results are displayed) Results for orders placed or performed during the hospital encounter of 12/06/19  BMET  Result Value Ref Range   Sodium 138 135 - 145 mmol/L   Potassium 3.9 3.5 - 5.1 mmol/L   Chloride 106 98 - 111 mmol/L   CO2 24 22 - 32 mmol/L   Glucose, Bld 111 (H) 70 - 99 mg/dL   BUN 6 6 - 20 mg/dL   Creatinine, Ser 0.58 0.44 - 1.00 mg/dL   Calcium 9.4 8.9 - 10.3 mg/dL   GFR calc non Af Amer >60 >60 mL/min   GFR calc Af Amer >60 >60 mL/min   Anion gap 8 5 - 15  CBC  Result Value Ref Range   WBC 6.8 4.0 - 10.5 K/uL   RBC 4.61 3.87 - 5.11 MIL/uL   Hemoglobin 13.7 12.0 - 15.0 g/dL   HCT 40.6 36.0 - 46.0 %   MCV 88.1 80.0 - 100.0 fL   MCH 29.7 26.0 - 34.0 pg   MCHC 33.7 30.0 - 36.0 g/dL   RDW 11.9 11.5 - 15.5 %   Platelets 257 150 - 400 K/uL   nRBC 0.0 0.0 - 0.2 %    EKG None  Radiology No results found.  Procedures Procedures (including critical care time)  Medications Ordered in ED Medications  acetaminophen (TYLENOL) tablet 1,000 mg (has no administration in time range)    ED Course  I have reviewed the triage vital signs and the nursing notes.  Pertinent labs & imaging results that were available during my care of the patient were reviewed by me and considered in my medical  decision making (see chart for details).    MDM Rules/Calculators/A&P                     Labs sent. Monitor bp.   Reviewed nursing notes and prior charts for additional history. Recent covid test  12/31 positive.   Acetaminophen po. Po fluids.   Labs reviewed/interpreted by me - chem normal.  Recheck bp, 122/87. Patient is breathing comfortably, no increased wob. Pulse ox 98% room air. Pt appears stable for d/c.    Michaella Cremeens was evaluated in Emergency Department on 12/06/2019 for the symptoms described in the history of present illness. She was evaluated in the context of the global COVID-19 pandemic, which necessitated consideration that the patient might be at risk for infection with the SARS-CoV-2 virus that causes COVID-19. Institutional protocols and algorithms that pertain to the evaluation of patients at risk for COVID-19 are in a state of rapid change based on information released by regulatory bodies including the CDC and federal and state organizations. These policies and algorithms were followed during the patient's care in the ED.    Final Clinical Impression(s) / ED Diagnoses Final diagnoses:  None    Rx / DC Orders ED Discharge Orders    None       Cathren Laine, MD 12/06/19 1527

## 2019-12-06 NOTE — ED Triage Notes (Signed)
Pt reports being covid + and has been checking bp at home and concerned bc it is elevated. Denies hx of htn. No resp distress is noted.

## 2019-12-09 ENCOUNTER — Other Ambulatory Visit: Payer: Self-pay

## 2019-12-09 ENCOUNTER — Telehealth (INDEPENDENT_AMBULATORY_CARE_PROVIDER_SITE_OTHER): Payer: BC Managed Care – PPO | Admitting: Emergency Medicine

## 2019-12-09 ENCOUNTER — Encounter: Payer: Self-pay | Admitting: Emergency Medicine

## 2019-12-09 VITALS — BP 143/108 | HR 98 | Ht 61.0 in | Wt 138.0 lb

## 2019-12-09 DIAGNOSIS — U071 COVID-19: Secondary | ICD-10-CM | POA: Diagnosis not present

## 2019-12-09 DIAGNOSIS — I10 Essential (primary) hypertension: Secondary | ICD-10-CM

## 2019-12-09 DIAGNOSIS — G4700 Insomnia, unspecified: Secondary | ICD-10-CM

## 2019-12-09 MED ORDER — LISINOPRIL 10 MG PO TABS: 10.0000 mg | ORAL_TABLET | Freq: Every day | ORAL | 3 refills | Status: DC

## 2019-12-09 MED ORDER — HYDROXYZINE HCL 25 MG PO TABS: 25.0000 mg | ORAL_TABLET | Freq: Every evening | ORAL | 1 refills | Status: AC | PRN

## 2019-12-09 NOTE — Progress Notes (Signed)
Telemedicine Encounter- SOAP NOTE Established Patient  This telephone encounter was conducted with the patient's (or proxy's) verbal consent via audio telecommunications: yes/no: Yes Patient was instructed to have this encounter in a suitably private space; and to only have persons present to whom they give permission to participate. In addition, patient identity was confirmed by use of name plus two identifiers (DOB and address).  I discussed the limitations, risks, security and privacy concerns of performing an evaluation and management service by telephone and the availability of in person appointments. I also discussed with the patient that there may be a patient responsible charge related to this service. The patient expressed understanding and agreed to proceed.  I spent a total of TIME; 0 MIN TO 60 MIN: 15 minutes talking with the patient or their proxy.  Chief Complaint  Patient presents with  . Hypertension    follow up per patient 2-3 days ago 160/104 and today 143/108  . Insomnia    per patient unable to sleep x 4 days    Subjective   Pamela Stanley is a 44 y.o. female established patient. Telephone visit today for hospital follow-up visit on 12/06/2019.  Patient tested positive for Covid on 12/02/2019.  Symptoms getting better.  Denies difficulty breathing, fever or chills. States her blood pressure readings have been high at home and they were high in the emergency department.  Was not started on medication.  Systolic blood pressure 119-4 160s over 100s.  Denies chest pain or difficulty breathing.  Asymptomatic. Complaining of trouble sleeping. [place] interpreter [phone] use for interpretation of [place].  HPI   Patient Active Problem List   Diagnosis Date Noted  . Language barrier 01/03/2013    Past Medical History:  Diagnosis Date  . Gallstones   . GERD (gastroesophageal reflux disease)   . Ulcer     Current Outpatient Medications  Medication Sig Dispense  Refill  . Acetaminophen (TYLENOL PO) Take by mouth.    Marland Kitchen azithromycin (ZITHROMAX) 250 MG tablet Sig as indicated (Patient not taking: Reported on 12/09/2019) 6 tablet 0  . hydrOXYzine (ATARAX/VISTARIL) 25 MG tablet Take 1 tablet (25 mg total) by mouth at bedtime as needed. 30 tablet 1  . lisinopril (ZESTRIL) 10 MG tablet Take 1 tablet (10 mg total) by mouth daily. 90 tablet 3  . naproxen (NAPROSYN) 375 MG tablet Take 1 tablet (375 mg total) by mouth 2 (two) times daily. (Patient not taking: Reported on 12/01/2019) 20 tablet 0  . Pseudoeph-Doxylamine-DM-APAP (NYQUIL PO) Take by mouth daily.     No current facility-administered medications for this visit.    No Known Allergies  Social History   Socioeconomic History  . Marital status: Married    Spouse name: Not on file  . Number of children: 0  . Years of education: Not on file  . Highest education level: Not on file  Occupational History  . Not on file  Tobacco Use  . Smoking status: Never Smoker  . Smokeless tobacco: Never Used  Substance and Sexual Activity  . Alcohol use: No    Alcohol/week: 0.0 standard drinks  . Drug use: No  . Sexual activity: Yes    Birth control/protection: None  Other Topics Concern  . Not on file  Social History Narrative  . Not on file   Social Determinants of Health   Financial Resource Strain:   . Difficulty of Paying Living Expenses: Not on file  Food Insecurity:   . Worried About Estate manager/land agent  of Food in the Last Year: Not on file  . Ran Out of Food in the Last Year: Not on file  Transportation Needs:   . Lack of Transportation (Medical): Not on file  . Lack of Transportation (Non-Medical): Not on file  Physical Activity:   . Days of Exercise per Week: Not on file  . Minutes of Exercise per Session: Not on file  Stress:   . Feeling of Stress : Not on file  Social Connections:   . Frequency of Communication with Friends and Family: Not on file  . Frequency of Social Gatherings with  Friends and Family: Not on file  . Attends Religious Services: Not on file  . Active Member of Clubs or Organizations: Not on file  . Attends Banker Meetings: Not on file  . Marital Status: Not on file  Intimate Partner Violence:   . Fear of Current or Ex-Partner: Not on file  . Emotionally Abused: Not on file  . Physically Abused: Not on file  . Sexually Abused: Not on file    Review of Systems  Constitutional: Negative.  Negative for chills and fever.  HENT: Negative.  Negative for congestion and sore throat.   Eyes: Negative.   Respiratory: Negative.  Negative for cough and shortness of breath.   Cardiovascular: Negative.  Negative for chest pain, palpitations and leg swelling.  Gastrointestinal: Negative.  Negative for abdominal pain, blood in stool, diarrhea, melena, nausea and vomiting.  Genitourinary: Negative.  Negative for dysuria and hematuria.  Musculoskeletal: Negative.  Negative for myalgias and neck pain.  Skin: Negative.  Negative for rash.  Neurological: Negative.  Negative for dizziness, sensory change, focal weakness, seizures, loss of consciousness and headaches.  Endo/Heme/Allergies: Negative.   Psychiatric/Behavioral: The patient has insomnia.   All other systems reviewed and are negative.   Objective  Alert and oriented x3 in no apparent respiratory distress Vitals as reported by the patient: Today's Vitals   12/09/19 1008  BP: (!) 143/108  Pulse: 98  Weight: 138 lb (62.6 kg)  Height: 5\' 1"  (1.549 m)    Clifford was seen today for hypertension and insomnia.  Diagnoses and all orders for this visit:  Essential hypertension -     lisinopril (ZESTRIL) 10 MG tablet; Take 1 tablet (10 mg total) by mouth daily.  Insomnia, unspecified type -     hydrOXYzine (ATARAX/VISTARIL) 25 MG tablet; Take 1 tablet (25 mg total) by mouth at bedtime as needed.  COVID-19 virus infection Comments: Improving   Clinically stable.  No red flag signs or  symptoms.  Covid/ED precautions given.  Will start antihypertensive medication and follow-up in the office in 4 to 6 weeks.   I discussed the assessment and treatment plan with the patient. The patient was provided an opportunity to ask questions and all were answered. The patient agreed with the plan and demonstrated an understanding of the instructions.   The patient was advised to call back or seek an in-person evaluation if the symptoms worsen or if the condition fails to improve as anticipated.  I provided 15 minutes of non-face-to-face time during this encounter.  , MD  Primary Care at [facility]

## 2019-12-14 ENCOUNTER — Ambulatory Visit: Payer: BC Managed Care – PPO | Attending: Internal Medicine

## 2019-12-14 DIAGNOSIS — Z20822 Contact with and (suspected) exposure to covid-19: Secondary | ICD-10-CM

## 2019-12-16 LAB — NOVEL CORONAVIRUS, NAA: SARS-CoV-2, NAA: NOT DETECTED

## 2019-12-20 ENCOUNTER — Other Ambulatory Visit: Payer: Self-pay

## 2019-12-20 ENCOUNTER — Encounter: Payer: Self-pay | Admitting: Emergency Medicine

## 2019-12-20 ENCOUNTER — Ambulatory Visit: Payer: BC Managed Care – PPO | Admitting: Emergency Medicine

## 2019-12-20 VITALS — BP 110/75 | HR 85 | Temp 98.2°F | Resp 16 | Ht 61.0 in | Wt 138.0 lb

## 2019-12-20 DIAGNOSIS — R002 Palpitations: Secondary | ICD-10-CM | POA: Diagnosis not present

## 2019-12-20 DIAGNOSIS — Z8616 Personal history of COVID-19: Secondary | ICD-10-CM | POA: Diagnosis not present

## 2019-12-20 DIAGNOSIS — G4700 Insomnia, unspecified: Secondary | ICD-10-CM

## 2019-12-20 DIAGNOSIS — I10 Essential (primary) hypertension: Secondary | ICD-10-CM

## 2019-12-20 DIAGNOSIS — U071 COVID-19: Secondary | ICD-10-CM

## 2019-12-20 NOTE — Patient Instructions (Addendum)
If you have lab work done today you will be contacted with your lab results within the next 2 weeks.  If you have not heard from [place] then please contact us. The fastest way to get your results is to register for My Chart.   IF you received an x-ray today, you will receive an invoice from [facility]. Please contact [facility] at [phone] with questions or concerns regarding your invoice.   IF you received labwork today, you will receive an invoice from [facility]. Please contact [facility] at [phone] with questions or concerns regarding your invoice.   Our billing staff will not be able to assist you with questions regarding bills from these companies.  You will be contacted with the lab results as soon as they are available. The fastest way to get your results is to activate your My Chart account. Instructions are located on the last page of this paperwork. If you have not heard from [place] regarding the results in 2 weeks, please contact this office.     ?nh tr?ng ng?c Palpitations ?nh tr?ng ng?c l c?m gic nh?p ??p c?a tim qu v? khng ??u ho?c nhanh h?n bnh th??ng. Qu v? c th? c?m th?y nh? tim mnh rung ln ho?c b? nh?p. ?nh tr?ng ng?c th??ng khng ph?i l m?t v?n ?? nghim tr?ng. N co? th? xu?t hi?n do nhi?u y?u t?, bao g?m hu?t thu?c, caffeine, r???u, c?ng th??ng va? m?t s? loa?i thu?c ho?c ma ty nh?t ??nh. H?u h?t cc nguyn nhn gy ?nh tr?ng ng?c khng nghim tr?ng. Tuy nhin, m?t s? tnh tr?ng ?nh tr?ng ng?c c th? l d?u hi?u c?a m?t v?n ?? nghim tr?ng. Qu v? c th? c?n ???c ki?m tra thm ?? lo?i tr? cc v?n ?? y t? nghim tr?ng. Tun th? nh?ng h??ng d?n ny ? nh:     Ch  ??n b?t c? thay ??i no v? tnh tr?ng c?a qu v?. Th??c hi?n nh?ng hnh ??ng ny ?? gip x? tr cc tri?u ch?ng c?a qu v?: ?n v u?ng  Trnh cc th?c ?n v ?? u?ng c th? gy ?nh tr?ng ng?c. Nh?ng ?? ?n, u?ng ny c th? bao g?m: ? C ph, tr, n??c gi?i  kht, thu?c gi?m cn v ?? u?ng giu n?ng l??ng c caffeine. ? S-c-la. ? R??u. L?i s?ng  Th?c hi?n cc b??c ?? gi?m c?ng th?ng v lo l?ng. Nh?ng th? c th? gip qu v? th? gin bao g?m: ? Yoga. ? Cc ho?t ??ng k?t h?p tm tr-c? th?, ch?ng h?n nh? th? su, thi?n ??nh, ho?c s? d?ng t? ng? v hnh ?nh ?? t?o suy ngh? tch c?c (t??ng t??ng theo d?n h??ng). ? Ho?t ??ng th? ch?t nh? b?i l?i, ch?y b? ho?c ?i b?. Cho chuyn gia ch?m [place] s?c kh?e bi?t n?u qu v? b? ?nh tr?ng ng?c t?ng ln khi ho?t ??ng. N?u qu v? b? ?au ng?c ho?c th? d?c khi ho?t ??ng, khng ti?p t?c ho?t ??ng ? cho ??n khi qu v? ? ???c chuyn gia ch?m [place] s?c kh?e khm. ? Pha?n h?i sinh ho?c. ?y la? m?t ph??ng pha?p giu?p quy? vi? ho?c ca?ch s?? du?ng tm tri? c?a mi?nh ?? ki?m soa?t ca?c y?u t? trong c? th?, ch??ng ha?n nh? nhi?p ??p c?a tim.  Khng s? d?ng ma ty, bao g?m cocaine ho?c ecstasy (thu?c l?c). Khng s? d?ng marijuana (c?n sa).  Ngh? ng?i v ng? th?t nhi?u. Duy tr th?i gian ng? ??u ??n. H??ng d?n chung  Ch? s? d?ng thu?c khng k ??n v thu?c k ??n theo ch? d?n c?a chuyn gia ch?m [place] s?c kh?e.  Khng s? d?ng b?t k? s?n ph?m no c nicotine ho?c thu?c l, ch?ng ha?n nh? thu?c l d?ng ht v thu?c l ?i?n t?. N?u qu v? c?n gip ?? ?? cai thu?c, hy h?i chuyn gia ch?m [place] s?c kh?e.  Tun th? t?t c? cc l?n khm theo di theo ch? d?n c?a chuyn gia ch?m [place] s?c kh?e. ?i?u ny c vai tr quan tr?ng. Nh?ng l?n khm ny c th? bao g?m khm ?? ki?m tra thm n?u ?nh tr?ng ng?c khng h?t ho?c tr?m tr?ng h?n. Hy lin l?c v?i chuyn gia ch?m [place] s?c kh?e n?u qu v?:  Ti?p t?c c nh?p tim nhanh ho?c khng ??u sau 24 ti?ng.  Nh?n th?y r?ng tnh tr?ng ?nh tr?ng ng?c c?a qu v? x?y ra th??ng xuyn h?n. Yu c?u tr? gip ngay l?p t?c n?u qu v?:  B? ?au ng?c ho?c kh th?.  B? ?au ??u d? d?i.  C?m th?y chng m?t ho?c ng?t x?u. Tm t?t  ?nh tr?ng ng?c l c?m gic nh?p ??p c?a tim qu v? khng ??u ho?c  nhanh h?n bnh th??ng. Qu v? c th? c?m th?y nh? tim mnh rung ln ho?c b? nh?p.  ?nh tr?ng ng?c co? th? l do nhi?u y?u t?, bao g?m hu?t thu?c, caffeine, r???u, c?ng th??ng va? m?t s? loa?i thu?c ho?c ma ty nh?t ??nh.  M?c du? h?u h?t ca?c nguyn nhn gy ra ?a?nh tr?ng ng??c l khng nghim tro?ng, nh?ng m?t s? nguyn nhn co? th? la? m?t d?u hi?u cu?a m?t v?n ?? y t? nghim tro?ng.  Tm s? tr? gip ngay l?p t?c n?u qu v? b? ng?t x?u ho?c ?au ng?c, kh th?, ?au ??u d? d?i, ho?c chng m?t. Thng tin ny khng nh?m m?c ?ch thay th? cho l?i khuyn m chuyn gia ch?m [place] s?c kh?e ni v?i qu v?. Hy b?o ??m qu v? ph?i th?o lu?n b?t k? v?n ?? g m qu v? c v?i chuyn gia ch?m [place] s?c kh?e c?a qu v?. Document Revised: 02/27/2018 Document Reviewed: 02/27/2018 Elsevier Patient Education  2020 [employer].

## 2019-12-20 NOTE — Progress Notes (Signed)
Pamela Stanley 44 y.o.   Chief Complaint  Patient presents with  . Insomnia  . Palpitations    when I lying down - ALL symptoms started after testing positive with COVID    HISTORY OF PRESENT ILLNESS: This is a 44 y.o. female complaining of palpitations.  [employer] translator Freada Bergeron [phone] used for [place] translation. Telemedicine visit with me on 12/09/2019 with hypertension and insomnia.  Treated with lisinopril 10 mg daily and hydroxyzine 25 mg at bedtime with good results.  Her blood pressure readings have been normal at home.  Recent COVID-19 infection.  Most recent test was negative on 12/14/2019. She has however noticed some palpitations both rapid heartbeat and skipping heartbeats mostly when she lies down for the past several days slowly getting better.  Denies chest pain or difficulty breathing.  No other associated symptoms.  Denies syncope.  HPI   Prior to Admission medications   Medication Sig Start Date End Date Taking? Authorizing Provider  hydrOXYzine (ATARAX/VISTARIL) 25 MG tablet Take 1 tablet (25 mg total) by mouth at bedtime as needed. 12/09/19  Yes Nael Petrosyan, Eilleen Kempf, MD  lisinopril (ZESTRIL) 10 MG tablet Take 1 tablet (10 mg total) by mouth daily. 12/09/19  Yes Darus Hershman, Eilleen Kempf, MD  naproxen (NAPROSYN) 375 MG tablet Take 1 tablet (375 mg total) by mouth 2 (two) times daily. 08/13/19  Yes Wurst, [place], PA-C  Acetaminophen (TYLENOL PO) Take by mouth.    [provider]  Pseudoeph-Doxylamine-DM-APAP (NYQUIL PO) Take by mouth daily.    [provider]  dicyclomine (BENTYL) 20 MG tablet Take 1 tablet (20 mg total) by mouth every 6 (six) hours as needed for spasms. 05/12/19 08/13/19  Georgina Quint, MD  pantoprazole (PROTONIX) 40 MG tablet Take 1 tablet by mouth daily. 06/17/19 08/13/19  [provider]    No Known Allergies  Patient Active Problem List   Diagnosis Date Noted  . Language barrier 01/03/2013    Past Medical  History:  Diagnosis Date  . Gallstones   . GERD (gastroesophageal reflux disease)   . Ulcer     History reviewed. No pertinent surgical history.  Social History   Socioeconomic History  . Marital status: Married    Spouse name: Not on file  . Number of children: 0  . Years of education: Not on file  . Highest education level: Not on file  Occupational History  . Not on file  Tobacco Use  . Smoking status: Never Smoker  . Smokeless tobacco: Never Used  Substance and Sexual Activity  . Alcohol use: No    Alcohol/week: 0.0 standard drinks  . Drug use: No  . Sexual activity: Yes    Birth control/protection: None  Other Topics Concern  . Not on file  Social History Narrative  . Not on file   Social Determinants of Health   Financial Resource Strain:   . Difficulty of Paying Living Expenses: Not on file  Food Insecurity:   . Worried About Programme researcher, broadcasting/film/video in the Last Year: Not on file  . Ran Out of Food in the Last Year: Not on file  Transportation Needs:   . Lack of Transportation (Medical): Not on file  . Lack of Transportation (Non-Medical): Not on file  Physical Activity:   . Days of Exercise per Week: Not on file  . Minutes of Exercise per Session: Not on file  Stress:   . Feeling of Stress : Not on file  Social Connections:   .  Frequency of Communication with Friends and Family: Not on file  . Frequency of Social Gatherings with Friends and Family: Not on file  . Attends Religious Services: Not on file  . Active Member of Clubs or Organizations: Not on file  . Attends Banker Meetings: Not on file  . Marital Status: Not on file  Intimate Partner Violence:   . Fear of Current or Ex-Partner: Not on file  . Emotionally Abused: Not on file  . Physically Abused: Not on file  . Sexually Abused: Not on file    Family History  Problem Relation Age of Onset  . Hyperlipidemia Mother   . Hypertension Mother   . Hypertension Father   .  Hyperlipidemia Father      Review of Systems  Constitutional: Negative.  Negative for chills and fever.  HENT: Negative.  Negative for congestion and sore throat.   Respiratory: Negative.  Negative for cough and shortness of breath.   Cardiovascular: Positive for palpitations. Negative for chest pain.  Gastrointestinal: Negative.  Negative for abdominal pain, blood in stool, diarrhea, nausea and vomiting.  Genitourinary: Negative.  Negative for dysuria and hematuria.  Musculoskeletal: Negative.  Negative for myalgias.  Skin: Negative.  Negative for rash.  Neurological: Negative.  Negative for dizziness and headaches.  Endo/Heme/Allergies: Negative.   Psychiatric/Behavioral: The patient has insomnia.   All other systems reviewed and are negative.   Vitals:   12/20/19 1036  BP: 110/75  Pulse: 85  Resp: 16  Temp: 98.2 F (36.8 C)  SpO2: 98%    Physical Exam Vitals reviewed.  Constitutional:      Appearance: Normal appearance.  HENT:     Head: Normocephalic.  Eyes:     Extraocular Movements: Extraocular movements intact.     Conjunctiva/sclera: Conjunctivae normal.     Pupils: Pupils are equal, round, and reactive to light.  Cardiovascular:     Rate and Rhythm: Normal rate and regular rhythm.     Pulses: Normal pulses.     Heart sounds: Normal heart sounds.  Pulmonary:     Effort: Pulmonary effort is normal.     Breath sounds: Normal breath sounds.  Musculoskeletal:        General: Normal range of motion.     Cervical back: Normal range of motion and neck supple.  Skin:    General: Skin is warm and dry.     Capillary Refill: Capillary refill takes less than 2 seconds.  Neurological:     General: No focal deficit present.     Mental Status: She is alert and oriented to person, place, and time.  Psychiatric:        Mood and Affect: Mood normal.        Behavior: Behavior normal.    EKG: Normal sinus rhythm with ventricular rate of 78/min.  No acute ischemic  changes.  Normal EKG.    A total of 30 minutes was spent in the room with the patient, greater than 50% of which was in counseling/coordination of care regarding differential diagnosis of palpitations, recent Covid infection, review of medications, review of most recent blood work and ED visit, diet and nutrition, review of her EKG, prognosis and need for follow-up in 4 weeks.  ASSESSMENT & PLAN: Anjolie was seen today for insomnia and palpitations.  Diagnoses and all orders for this visit:  Palpitations -     EKG 12-Lead  Essential hypertension  Insomnia, unspecified type  COVID-19 virus infection Comments: Recent infection and  recovering well    Patient Instructions       If you have lab work done today you will be contacted with your lab results within the next 2 weeks.  If you have not heard from [place] then please contact us. The fastest way to get your results is to register for My Chart.   IF you received an x-ray today, you will receive an invoice from [facility]. Please contact [facility] at [phone] with questions or concerns regarding your invoice.   IF you received labwork today, you will receive an invoice from [facility]. Please contact [facility] at [phone] with questions or concerns regarding your invoice.   Our billing staff will not be able to assist you with questions regarding bills from these companies.  You will be contacted with the lab results as soon as they are available. The fastest way to get your results is to activate your My Chart account. Instructions are located on the last page of this paperwork. If you have not heard from [place] regarding the results in 2 weeks, please contact this office.     ?nh tr?ng ng?c Palpitations ?nh tr?ng ng?c l c?m gic nh?p ??p c?a tim qu v? khng ??u ho?c nhanh h?n bnh th??ng. Qu v? c th? c?m th?y nh? tim mnh rung ln ho?c b? nh?p. ?nh tr?ng ng?c th??ng khng ph?i l m?t v?n ?? nghim  tr?ng. N co? th? xu?t hi?n do nhi?u y?u t?, bao g?m hu?t thu?c, caffeine, r???u, c?ng th??ng va? m?t s? loa?i thu?c ho?c ma ty nh?t ??nh. H?u h?t cc nguyn nhn gy ?nh tr?ng ng?c khng nghim tr?ng. Tuy nhin, m?t s? tnh tr?ng ?nh tr?ng ng?c c th? l d?u hi?u c?a m?t v?n ?? nghim tr?ng. Qu v? c th? c?n ???c ki?m tra thm ?? lo?i tr? cc v?n ?? y t? nghim tr?ng. Tun th? nh?ng h??ng d?n ny ? nh:     Ch  ??n b?t c? thay ??i no v? tnh tr?ng c?a qu v?. Th??c hi?n nh?ng hnh ??ng ny ?? gip x? tr cc tri?u ch?ng c?a qu v?: ?n v u?ng  Trnh cc th?c ?n v ?? u?ng c th? gy ?nh tr?ng ng?c. Nh?ng ?? ?n, u?ng ny c th? bao g?m: ? C ph, tr, n??c gi?i kht, thu?c gi?m cn v ?? u?ng giu n?ng l??ng c caffeine. ? S-c-la. ? R??u. L?i s?ng  Th?c hi?n cc b??c ?? gi?m c?ng th?ng v lo l?ng. Nh?ng th? c th? gip qu v? th? gin bao g?m: ? Yoga. ? Cc ho?t ??ng k?t h?p tm tr-c? th?, ch?ng h?n nh? th? su, thi?n ??nh, ho?c s? d?ng t? ng? v hnh ?nh ?? t?o suy ngh? tch c?c (t??ng t??ng theo d?n h??ng). ? Ho?t ??ng th? ch?t nh? b?i l?i, ch?y b? ho?c ?i b?. Cho chuyn gia ch?m [place] s?c kh?e bi?t n?u qu v? b? ?nh tr?ng ng?c t?ng ln khi ho?t ??ng. N?u qu v? b? ?au ng?c ho?c th? d?c khi ho?t ??ng, khng ti?p t?c ho?t ??ng ? cho ??n khi qu v? ? ???c chuyn gia ch?m [place] s?c kh?e khm. ? Pha?n h?i sinh ho?c. ?y la? m?t ph??ng pha?p giu?p quy? vi? ho?c ca?ch s?? du?ng tm tri? c?a mi?nh ?? ki?m soa?t ca?c y?u t? trong c? th?, ch??ng ha?n nh? nhi?p ??p c?a tim.  Khng s? d?ng ma ty, bao g?m cocaine ho?c ecstasy (thu?c l?c). Khng s? d?ng marijuana (c?n sa).  Ngh? ng?i v ng? th?t nhi?u.  Duy tr th?i gian ng? ??u ??n. H??ng d?n chung  Ch? s? d?ng thu?c khng k ??n v thu?c k ??n theo ch? d?n c?a chuyn gia ch?m [place] s?c kh?e.  Khng s? d?ng b?t k? s?n ph?m no c nicotine ho?c thu?c l, ch?ng ha?n nh? thu?c l d?ng ht v thu?c l ?i?n t?. N?u qu v? c?n  gip ?? ?? cai thu?c, hy h?i chuyn gia ch?m [place] s?c kh?e.  Tun th? t?t c? cc l?n khm theo di theo ch? d?n c?a chuyn gia ch?m [place] s?c kh?e. ?i?u ny c vai tr quan tr?ng. Nh?ng l?n khm ny c th? bao g?m khm ?? ki?m tra thm n?u ?nh tr?ng ng?c khng h?t ho?c tr?m tr?ng h?n. Hy lin l?c v?i chuyn gia ch?m [place] s?c kh?e n?u qu v?:  Ti?p t?c c nh?p tim nhanh ho?c khng ??u sau 24 ti?ng.  Nh?n th?y r?ng tnh tr?ng ?nh tr?ng ng?c c?a qu v? x?y ra th??ng xuyn h?n. Yu c?u tr? gip ngay l?p t?c n?u qu v?:  B? ?au ng?c ho?c kh th?.  B? ?au ??u d? d?i.  C?m th?y chng m?t ho?c ng?t x?u. Tm t?t  ?nh tr?ng ng?c l c?m gic nh?p ??p c?a tim qu v? khng ??u ho?c nhanh h?n bnh th??ng. Qu v? c th? c?m th?y nh? tim mnh rung ln ho?c b? nh?p.  ?nh tr?ng ng?c co? th? l do nhi?u y?u t?, bao g?m hu?t thu?c, caffeine, r???u, c?ng th??ng va? m?t s? loa?i thu?c ho?c ma ty nh?t ??nh.  M?c du? h?u h?t ca?c nguyn nhn gy ra ?a?nh tr?ng ng??c l khng nghim tro?ng, nh?ng m?t s? nguyn nhn co? th? la? m?t d?u hi?u cu?a m?t v?n ?? y t? nghim tro?ng.  Tm s? tr? gip ngay l?p t?c n?u qu v? b? ng?t x?u ho?c ?au ng?c, kh th?, ?au ??u d? d?i, ho?c chng m?t. Thng tin ny khng nh?m m?c ?ch thay th? cho l?i khuyn m chuyn gia ch?m [place] s?c kh?e ni v?i qu v?. Hy b?o ??m qu v? ph?i th?o lu?n b?t k? v?n ?? g m qu v? c v?i chuyn gia ch?m [place] s?c kh?e c?a qu v?. Document Revised: 02/27/2018 Document Reviewed: 02/27/2018 Elsevier Patient Education  2020 Elsevier Inc.      Agustina Caroli, MD [facility]

## 2019-12-30 ENCOUNTER — Ambulatory Visit: Payer: BC Managed Care – PPO | Admitting: Emergency Medicine

## 2020-01-17 ENCOUNTER — Encounter: Payer: Self-pay | Admitting: Emergency Medicine

## 2020-01-17 ENCOUNTER — Ambulatory Visit: Payer: BC Managed Care – PPO | Admitting: Emergency Medicine

## 2020-01-17 ENCOUNTER — Other Ambulatory Visit: Payer: Self-pay

## 2020-01-17 VITALS — BP 115/79 | HR 89 | Temp 98.9°F | Resp 16 | Ht 61.0 in | Wt 140.0 lb

## 2020-01-17 DIAGNOSIS — R002 Palpitations: Secondary | ICD-10-CM

## 2020-01-17 DIAGNOSIS — Z8616 Personal history of COVID-19: Secondary | ICD-10-CM | POA: Diagnosis not present

## 2020-01-17 NOTE — Patient Instructions (Addendum)
   If you have lab work done today you will be contacted with your lab results within the next 2 weeks.  If you have not heard from us then please contact us. The fastest way to get your results is to register for My Chart.   IF you received an x-ray today, you will receive an invoice from [facility]. Please contact [facility] at [phone] with questions or concerns regarding your invoice.   IF you received labwork today, you will receive an invoice from [facility]. Please contact [facility] at [phone] with questions or concerns regarding your invoice.   Our billing staff will not be able to assist you with questions regarding bills from these companies.  You will be contacted with the lab results as soon as they are available. The fastest way to get your results is to activate your My Chart account. Instructions are located on the last page of this paperwork. If you have not heard from us regarding the results in 2 weeks, please contact this office.     ?nh tr?ng ng?c Palpitations ?nh tr?ng ng?c l c?m gic nh?p ??p c?a tim qu v? khng ??u ho?c nhanh h?n bnh th??ng. Qu v? c th? c?m th?y nh? tim mnh rung ln ho?c b? nh?p. ?nh tr?ng ng?c th??ng khng ph?i l m?t v?n ?? nghim tr?ng. N co? th? xu?t hi?n do nhi?u y?u t?, bao g?m hu?t thu?c, caffeine, r???u, c?ng th??ng va? m?t s? loa?i thu?c ho?c ma ty nh?t ??nh. H?u h?t cc nguyn nhn gy ?nh tr?ng ng?c khng nghim tr?ng. Tuy nhin, m?t s? tnh tr?ng ?nh tr?ng ng?c c th? l d?u hi?u c?a m?t v?n ?? nghim tr?ng. Qu v? c th? c?n ???c ki?m tra thm ?? lo?i tr? cc v?n ?? y t? nghim tr?ng. Tun th? nh?ng h??ng d?n ny ? nh:     Ch  ??n b?t c? thay ??i no v? tnh tr?ng c?a qu v?. Th??c hi?n nh?ng hnh ??ng ny ?? gip x? tr cc tri?u ch?ng c?a qu v?: ?n v u?ng  Trnh cc th?c ?n v ?? u?ng c th? gy ?nh tr?ng ng?c. Nh?ng ?? ?n, u?ng ny c th? bao g?m: ? C ph, tr, n??c gi?i  kht, thu?c gi?m cn v ?? u?ng giu n?ng l??ng c caffeine. ? S-c-la. ? R??u. L?i s?ng  Th?c hi?n cc b??c ?? gi?m c?ng th?ng v lo l?ng. Nh?ng th? c th? gip qu v? th? gin bao g?m: ? Yoga. ? Cc ho?t ??ng k?t h?p tm tr-c? th?, ch?ng h?n nh? th? su, thi?n ??nh, ho?c s? d?ng t? ng? v hnh ?nh ?? t?o suy ngh? tch c?c (t??ng t??ng theo d?n h??ng). ? Ho?t ??ng th? ch?t nh? b?i l?i, ch?y b? ho?c ?i b?. Cho chuyn gia ch?m sc s?c kh?e bi?t n?u qu v? b? ?nh tr?ng ng?c t?ng ln khi ho?t ??ng. N?u qu v? b? ?au ng?c ho?c th? d?c khi ho?t ??ng, khng ti?p t?c ho?t ??ng ? cho ??n khi qu v? ? ???c chuyn gia ch?m sc s?c kh?e khm. ? Pha?n h?i sinh ho?c. ?y la? m?t ph??ng pha?p giu?p quy? vi? ho?c ca?ch s?? du?ng tm tri? c?a mi?nh ?? ki?m soa?t ca?c y?u t? trong c? th?, ch??ng ha?n nh? nhi?p ??p c?a tim.  Khng s? d?ng ma ty, bao g?m cocaine ho?c ecstasy (thu?c l?c). Khng s? d?ng marijuana (c?n sa).  Ngh? ng?i v ng? th?t nhi?u. Duy tr th?i gian ng? ??u ??n. H??ng d?n chung    Ch? s? d?ng thu?c khng k ??n v thu?c k ??n theo ch? d?n c?a chuyn gia ch?m [place] s?c kh?e.  Khng s? d?ng b?t k? s?n ph?m no c nicotine ho?c thu?c l, ch?ng ha?n nh? thu?c l d?ng ht v thu?c l ?i?n t?. N?u qu v? c?n gip ?? ?? cai thu?c, hy h?i chuyn gia ch?m [place] s?c kh?e.  Tun th? t?t c? cc l?n khm theo di theo ch? d?n c?a chuyn gia ch?m [place] s?c kh?e. ?i?u ny c vai tr quan tr?ng. Nh?ng l?n khm ny c th? bao g?m khm ?? ki?m tra thm n?u ?nh tr?ng ng?c khng h?t ho?c tr?m tr?ng h?n. Hy lin l?c v?i chuyn gia ch?m [place] s?c kh?e n?u qu v?:  Ti?p t?c c nh?p tim nhanh ho?c khng ??u sau 24 ti?ng.  Nh?n th?y r?ng tnh tr?ng ?nh tr?ng ng?c c?a qu v? x?y ra th??ng xuyn h?n. Yu c?u tr? gip ngay l?p t?c n?u qu v?:  B? ?au ng?c ho?c kh th?.  B? ?au ??u d? d?i.  C?m th?y chng m?t ho?c ng?t x?u. Tm t?t  ?nh tr?ng ng?c l c?m gic nh?p ??p c?a tim qu v? khng ??u ho?c  nhanh h?n bnh th??ng. Qu v? c th? c?m th?y nh? tim mnh rung ln ho?c b? nh?p.  ?nh tr?ng ng?c co? th? l do nhi?u y?u t?, bao g?m hu?t thu?c, caffeine, r???u, c?ng th??ng va? m?t s? loa?i thu?c ho?c ma ty nh?t ??nh.  M?c du? h?u h?t ca?c nguyn nhn gy ra ?a?nh tr?ng ng??c l khng nghim tro?ng, nh?ng m?t s? nguyn nhn co? th? la? m?t d?u hi?u cu?a m?t v?n ?? y t? nghim tro?ng.  Tm s? tr? gip ngay l?p t?c n?u qu v? b? ng?t x?u ho?c ?au ng?c, kh th?, ?au ??u d? d?i, ho?c chng m?t. Thng tin ny khng nh?m m?c ?ch thay th? cho l?i khuyn m chuyn gia ch?m [place] s?c kh?e ni v?i qu v?. Hy b?o ??m qu v? ph?i th?o lu?n b?t k? v?n ?? g m qu v? c v?i chuyn gia ch?m [place] s?c kh?e c?a qu v?. Document Revised: 02/27/2018 Document Reviewed: 02/27/2018 Elsevier Patient Education  2020 [employer].

## 2020-01-17 NOTE — Progress Notes (Signed)
BP Readings from Last 3 Encounters:  01/17/20 115/79  12/20/19 110/75  12/09/19 (!) 143/108   Pamela Stanley 44 y.o.   Chief Complaint  Patient presents with  . Palpitations    follow up 4 weeks  . Hypertension    per pt she stopped medication and blood pressure dropped    HISTORY OF PRESENT ILLNESS: This is a 44 y.o. female complaining of intermittent palpitations for the past several weeks since having Covid infection early last month.  Worse when lying down with no associated symptoms however she feels uncomfortable when they happen.  Feels like her heart is beating fast and irregularly.  No syncopal episode or chest pain but sometimes feels short of breath. No other complaints or associated symptoms. Stratus translator used for [place] language.  HPI   Prior to Admission medications   Medication Sig Start Date End Date Taking? Authorizing Provider  hydrOXYzine (ATARAX/VISTARIL) 25 MG tablet Take 1 tablet (25 mg total) by mouth at bedtime as needed. 12/09/19  Yes Aayla Marrocco, Eilleen Kempf, MD  Acetaminophen (TYLENOL PO) Take by mouth.    [provider]  lisinopril (ZESTRIL) 10 MG tablet Take 1 tablet (10 mg total) by mouth daily. Patient not taking: Reported on 01/17/2020 12/09/19   Georgina Quint, MD  naproxen (NAPROSYN) 375 MG tablet Take 1 tablet (375 mg total) by mouth 2 (two) times daily. Patient not taking: Reported on 01/17/2020 08/13/19   Wurst, [place], PA-C  Pseudoeph-Doxylamine-DM-APAP (NYQUIL PO) Take by mouth daily.    [provider]  dicyclomine (BENTYL) 20 MG tablet Take 1 tablet (20 mg total) by mouth every 6 (six) hours as needed for spasms. 05/12/19 08/13/19  Georgina Quint, MD  pantoprazole (PROTONIX) 40 MG tablet Take 1 tablet by mouth daily. 06/17/19 08/13/19  [provider]    No Known Allergies  Patient Active Problem List   Diagnosis Date Noted  . Language barrier 01/03/2013    Past Medical History:  Diagnosis  Date  . Gallstones   . GERD (gastroesophageal reflux disease)   . Ulcer     No past surgical history on file.  Social History   Socioeconomic History  . Marital status: Married    Spouse name: Not on file  . Number of children: 0  . Years of education: Not on file  . Highest education level: Not on file  Occupational History  . Not on file  Tobacco Use  . Smoking status: Never Smoker  . Smokeless tobacco: Never Used  Substance and Sexual Activity  . Alcohol use: No    Alcohol/week: 0.0 standard drinks  . Drug use: No  . Sexual activity: Yes    Birth control/protection: None  Other Topics Concern  . Not on file  Social History Narrative  . Not on file   Social Determinants of Health   Financial Resource Strain:   . Difficulty of Paying Living Expenses: Not on file  Food Insecurity:   . Worried About Programme researcher, broadcasting/film/video in the Last Year: Not on file  . Ran Out of Food in the Last Year: Not on file  Transportation Needs:   . Lack of Transportation (Medical): Not on file  . Lack of Transportation (Non-Medical): Not on file  Physical Activity:   . Days of Exercise per Week: Not on file  . Minutes of Exercise per Session: Not on file  Stress:   . Feeling of Stress : Not on file  Social Connections:   .  Frequency of Communication with Friends and Family: Not on file  . Frequency of Social Gatherings with Friends and Family: Not on file  . Attends Religious Services: Not on file  . Active Member of Clubs or Organizations: Not on file  . Attends Archivist Meetings: Not on file  . Marital Status: Not on file  Intimate Partner Violence:   . Fear of Current or Ex-Partner: Not on file  . Emotionally Abused: Not on file  . Physically Abused: Not on file  . Sexually Abused: Not on file    Family History  Problem Relation Age of Onset  . Hyperlipidemia Mother   . Hypertension Mother   . Hypertension Father   . Hyperlipidemia Father      Review of  Systems  Constitutional: Negative.  Negative for chills and fever.  HENT: Negative.  Negative for congestion and sore throat.   Respiratory: Negative.  Negative for cough and shortness of breath.   Cardiovascular: Positive for palpitations. Negative for chest pain.  Gastrointestinal: Negative.  Negative for abdominal pain, nausea and vomiting.  Genitourinary: Negative.  Negative for dysuria.  Musculoskeletal: Negative for back pain, myalgias and neck pain.  Skin: Negative.  Negative for rash.  Neurological: Negative.  Negative for dizziness and headaches.  Endo/Heme/Allergies: Negative.   All other systems reviewed and are negative.  Today's Vitals   01/17/20 0913  BP: 115/79  Pulse: 89  Resp: 16  Temp: 98.9 F (37.2 C)  TempSrc: Temporal  SpO2: 98%  Weight: 140 lb (63.5 kg)  Height: 5\' 1"  (1.549 m)   Body mass index is 26.45 kg/m.   Physical Exam Vitals reviewed.  Constitutional:      Appearance: Normal appearance.  HENT:     Head: Normocephalic.  Eyes:     Extraocular Movements: Extraocular movements intact.     Conjunctiva/sclera: Conjunctivae normal.     Pupils: Pupils are equal, round, and reactive to light.  Cardiovascular:     Rate and Rhythm: Normal rate and regular rhythm.     Pulses: Normal pulses.     Heart sounds: Normal heart sounds.  Pulmonary:     Effort: Pulmonary effort is normal.     Breath sounds: Normal breath sounds.  Musculoskeletal:        General: Normal range of motion.     Cervical back: Normal range of motion and neck supple.  Skin:    General: Skin is warm and dry.     Capillary Refill: Capillary refill takes less than 2 seconds.  Neurological:     General: No focal deficit present.     Mental Status: She is alert and oriented to person, place, and time.  Psychiatric:        Mood and Affect: Mood normal.        Behavior: Behavior normal.    EKG: Normal sinus rhythm with ventricular rate of 81/min.  No acute ischemic changes.   Normal EKG. A total of 30 minutes was spent with the patient, greater than 50% of which was in counseling/coordination of care regarding differential diagnosis of palpitations, review of most recent office visit notes, review of most recent blood work, need for diagnostic work-up including cardiology evaluation with possible echo and Holter monitoring, review of EKG, prognosis and need for follow-up.  ASSESSMENT & PLAN: Johnelle was seen today for palpitations and hypertension.  Diagnoses and all orders for this visit:  Palpitations -     Comprehensive metabolic panel -  CBC with Differential/Platelet -     TSH -     EKG 12-Lead -     Ambulatory referral to Cardiology  History of COVID-19    Patient Instructions       If you have lab work done today you will be contacted with your lab results within the next 2 weeks.  If you have not heard from [place] then please contact us. The fastest way to get your results is to register for My Chart.   IF you received an x-ray today, you will receive an invoice from [facility]. Please contact [facility] at [phone] with questions or concerns regarding your invoice.   IF you received labwork today, you will receive an invoice from [facility]. Please contact [facility] at [phone] with questions or concerns regarding your invoice.   Our billing staff will not be able to assist you with questions regarding bills from these companies.  You will be contacted with the lab results as soon as they are available. The fastest way to get your results is to activate your My Chart account. Instructions are located on the last page of this paperwork. If you have not heard from [place] regarding the results in 2 weeks, please contact this office.     ?nh tr?ng ng?c Palpitations ?nh tr?ng ng?c l c?m gic nh?p ??p c?a tim qu v? khng ??u ho?c nhanh h?n bnh th??ng. Qu v? c th? c?m th?y nh? tim mnh rung ln ho?c b? nh?p. ?nh tr?ng  ng?c th??ng khng ph?i l m?t v?n ?? nghim tr?ng. N co? th? xu?t hi?n do nhi?u y?u t?, bao g?m hu?t thu?c, caffeine, r???u, c?ng th??ng va? m?t s? loa?i thu?c ho?c ma ty nh?t ??nh. H?u h?t cc nguyn nhn gy ?nh tr?ng ng?c khng nghim tr?ng. Tuy nhin, m?t s? tnh tr?ng ?nh tr?ng ng?c c th? l d?u hi?u c?a m?t v?n ?? nghim tr?ng. Qu v? c th? c?n ???c ki?m tra thm ?? lo?i tr? cc v?n ?? y t? nghim tr?ng. Tun th? nh?ng h??ng d?n ny ? nh:     Ch  ??n b?t c? thay ??i no v? tnh tr?ng c?a qu v?. Th??c hi?n nh?ng hnh ??ng ny ?? gip x? tr cc tri?u ch?ng c?a qu v?: ?n v u?ng  Trnh cc th?c ?n v ?? u?ng c th? gy ?nh tr?ng ng?c. Nh?ng ?? ?n, u?ng ny c th? bao g?m: ? C ph, tr, n??c gi?i kht, thu?c gi?m cn v ?? u?ng giu n?ng l??ng c caffeine. ? S-c-la. ? R??u. L?i s?ng  Th?c hi?n cc b??c ?? gi?m c?ng th?ng v lo l?ng. Nh?ng th? c th? gip qu v? th? gin bao g?m: ? Yoga. ? Cc ho?t ??ng k?t h?p tm tr-c? th?, ch?ng h?n nh? th? su, thi?n ??nh, ho?c s? d?ng t? ng? v hnh ?nh ?? t?o suy ngh? tch c?c (t??ng t??ng theo d?n h??ng). ? Ho?t ??ng th? ch?t nh? b?i l?i, ch?y b? ho?c ?i b?. Cho chuyn gia ch?m [place] s?c kh?e bi?t n?u qu v? b? ?nh tr?ng ng?c t?ng ln khi ho?t ??ng. N?u qu v? b? ?au ng?c ho?c th? d?c khi ho?t ??ng, khng ti?p t?c ho?t ??ng ? cho ??n khi qu v? ? ???c chuyn gia ch?m [place] s?c kh?e khm. ? Pha?n h?i sinh ho?c. ?y la? m?t ph??ng pha?p giu?p quy? vi? ho?c ca?ch s?? du?ng tm tri? c?a mi?nh ?? ki?m soa?t ca?c y?u t? trong c? th?, ch??ng ha?n nh? nhi?p ??p c?a  tim.  Khng s? d?ng ma ty, bao g?m cocaine ho?c ecstasy (thu?c l?c). Khng s? d?ng marijuana (c?n sa).  Ngh? ng?i v ng? th?t nhi?u. Duy tr th?i gian ng? ??u ??n. H??ng d?n chung  Ch? s? d?ng thu?c khng k ??n v thu?c k ??n theo ch? d?n c?a chuyn gia ch?m [place] s?c kh?e.  Khng s? d?ng b?t k? s?n ph?m no c nicotine ho?c thu?c l, ch?ng ha?n nh? thu?c l  d?ng ht v thu?c l ?i?n t?. N?u qu v? c?n gip ?? ?? cai thu?c, hy h?i chuyn gia ch?m [place] s?c kh?e.  Tun th? t?t c? cc l?n khm theo di theo ch? d?n c?a chuyn gia ch?m [place] s?c kh?e. ?i?u ny c vai tr quan tr?ng. Nh?ng l?n khm ny c th? bao g?m khm ?? ki?m tra thm n?u ?nh tr?ng ng?c khng h?t ho?c tr?m tr?ng h?n. Hy lin l?c v?i chuyn gia ch?m [place] s?c kh?e n?u qu v?:  Ti?p t?c c nh?p tim nhanh ho?c khng ??u sau 24 ti?ng.  Nh?n th?y r?ng tnh tr?ng ?nh tr?ng ng?c c?a qu v? x?y ra th??ng xuyn h?n. Yu c?u tr? gip ngay l?p t?c n?u qu v?:  B? ?au ng?c ho?c kh th?.  B? ?au ??u d? d?i.  C?m th?y chng m?t ho?c ng?t x?u. Tm t?t  ?nh tr?ng ng?c l c?m gic nh?p ??p c?a tim qu v? khng ??u ho?c nhanh h?n bnh th??ng. Qu v? c th? c?m th?y nh? tim mnh rung ln ho?c b? nh?p.  ?nh tr?ng ng?c co? th? l do nhi?u y?u t?, bao g?m hu?t thu?c, caffeine, r???u, c?ng th??ng va? m?t s? loa?i thu?c ho?c ma ty nh?t ??nh.  M?c du? h?u h?t ca?c nguyn nhn gy ra ?a?nh tr?ng ng??c l khng nghim tro?ng, nh?ng m?t s? nguyn nhn co? th? la? m?t d?u hi?u cu?a m?t v?n ?? y t? nghim tro?ng.  Tm s? tr? gip ngay l?p t?c n?u qu v? b? ng?t x?u ho?c ?au ng?c, kh th?, ?au ??u d? d?i, ho?c chng m?t. Thng tin ny khng nh?m m?c ?ch thay th? cho l?i khuyn m chuyn gia ch?m [place] s?c kh?e ni v?i qu v?. Hy b?o ??m qu v? ph?i th?o lu?n b?t k? v?n ?? g m qu v? c v?i chuyn gia ch?m [place] s?c kh?e c?a qu v?. Document Revised: 02/27/2018 Document Reviewed: 02/27/2018 Elsevier Patient Education  2020 Elsevier Inc.      Edwina Barth, MD [facility]

## 2020-01-18 ENCOUNTER — Encounter: Payer: Self-pay | Admitting: Emergency Medicine

## 2020-01-18 LAB — COMPREHENSIVE METABOLIC PANEL
ALT: 13 IU/L (ref 0–32)
AST: 14 IU/L (ref 0–40)
Albumin/Globulin Ratio: 1.4 (ref 1.2–2.2)
Albumin: 4.3 g/dL (ref 3.8–4.8)
Alkaline Phosphatase: 53 IU/L (ref 39–117)
BUN/Creatinine Ratio: 15 (ref 9–23)
BUN: 10 mg/dL (ref 6–24)
Bilirubin Total: 0.2 mg/dL (ref 0.0–1.2)
CO2: 18 mmol/L — ABNORMAL LOW (ref 20–29)
Calcium: 9.4 mg/dL (ref 8.7–10.2)
Chloride: 102 mmol/L (ref 96–106)
Creatinine, Ser: 0.67 mg/dL (ref 0.57–1.00)
GFR calc Af Amer: 125 mL/min/{1.73_m2} (ref >59)
GFR calc non Af Amer: 108 mL/min/{1.73_m2} (ref >59)
Globulin, Total: 3.1 g/dL (ref 1.5–4.5)
Glucose: 99 mg/dL (ref 65–99)
Potassium: 4.3 mmol/L (ref 3.5–5.2)
Sodium: 137 mmol/L (ref 134–144)
Total Protein: 7.4 g/dL (ref 6.0–8.5)

## 2020-01-18 LAB — CBC WITH DIFFERENTIAL/PLATELET
Basophils Absolute: 0.1 10*3/uL (ref 0.0–0.2)
Basos: 1 %
EOS (ABSOLUTE): 0.4 10*3/uL (ref 0.0–0.4)
Eos: 5 %
Hematocrit: 41.5 % (ref 34.0–46.6)
Hemoglobin: 14.1 g/dL (ref 11.1–15.9)
Immature Grans (Abs): 0 10*3/uL (ref 0.0–0.1)
Immature Granulocytes: 0 %
Lymphocytes Absolute: 2.8 10*3/uL (ref 0.7–3.1)
Lymphs: 36 %
MCH: 29.9 pg (ref 26.6–33.0)
MCHC: 34 g/dL (ref 31.5–35.7)
MCV: 88 fL (ref 79–97)
Monocytes Absolute: 0.5 10*3/uL (ref 0.1–0.9)
Monocytes: 7 %
Neutrophils Absolute: 4 10*3/uL (ref 1.4–7.0)
Neutrophils: 51 %
Platelets: 249 10*3/uL (ref 150–450)
RBC: 4.71 x10E6/uL (ref 3.77–5.28)
RDW: 12.8 % (ref 11.7–15.4)
WBC: 7.8 10*3/uL (ref 3.4–10.8)

## 2020-01-18 LAB — TSH: TSH: 0.963 u[IU]/mL (ref 0.450–4.500)

## 2020-02-02 ENCOUNTER — Ambulatory Visit: Payer: BC Managed Care – PPO | Admitting: Emergency Medicine

## 2020-02-03 ENCOUNTER — Encounter: Payer: Self-pay | Admitting: Emergency Medicine

## 2020-02-10 ENCOUNTER — Encounter: Payer: Self-pay | Admitting: General Practice

## 2020-02-12 ENCOUNTER — Ambulatory Visit: Payer: BC Managed Care – PPO

## 2020-02-28 ENCOUNTER — Encounter: Payer: Self-pay | Admitting: Emergency Medicine

## 2020-03-04 ENCOUNTER — Other Ambulatory Visit: Payer: Self-pay

## 2020-03-04 ENCOUNTER — Ambulatory Visit
Admission: EM | Admit: 2020-03-04 | Discharge: 2020-03-04 | Disposition: A | Discharge status: Home / Self Care | Payer: BC Managed Care – PPO | Source: Home / Self Care

## 2020-03-04 ENCOUNTER — Emergency Department (HOSPITAL_COMMUNITY)
Admission: EM | Admit: 2020-03-04 | Discharge: 2020-03-05 | Disposition: A | Discharge status: Home / Self Care | Payer: BC Managed Care – PPO | Attending: Emergency Medicine | Admitting: Emergency Medicine

## 2020-03-04 ENCOUNTER — Encounter (HOSPITAL_COMMUNITY): Payer: Self-pay | Admitting: Emergency Medicine

## 2020-03-04 DIAGNOSIS — R1011 Right upper quadrant pain: Secondary | ICD-10-CM | POA: Diagnosis present

## 2020-03-04 DIAGNOSIS — Z8616 Personal history of COVID-19: Secondary | ICD-10-CM | POA: Diagnosis not present

## 2020-03-04 DIAGNOSIS — K8050 Calculus of bile duct without cholangitis or cholecystitis without obstruction: Secondary | ICD-10-CM | POA: Diagnosis not present

## 2020-03-04 DIAGNOSIS — Z79899 Other long term (current) drug therapy: Secondary | ICD-10-CM | POA: Diagnosis not present

## 2020-03-04 LAB — CBC
HCT: 40 % (ref 36.0–46.0)
Hemoglobin: 13.3 g/dL (ref 12.0–15.0)
MCH: 29.8 pg (ref 26.0–34.0)
MCHC: 33.3 g/dL (ref 30.0–36.0)
MCV: 89.7 fL (ref 80.0–100.0)
Platelets: 313 10*3/uL (ref 150–400)
RBC: 4.46 MIL/uL (ref 3.87–5.11)
RDW: 11.8 % (ref 11.5–15.5)
WBC: 9.5 10*3/uL (ref 4.0–10.5)
nRBC: 0 % (ref 0.0–0.2)

## 2020-03-04 LAB — URINALYSIS, ROUTINE W REFLEX MICROSCOPIC
Bilirubin Urine: NEGATIVE
Glucose, UA: NEGATIVE mg/dL
Hgb urine dipstick: NEGATIVE
Ketones, ur: NEGATIVE mg/dL
Nitrite: NEGATIVE
Protein, ur: NEGATIVE mg/dL
Specific Gravity, Urine: 1.009 (ref 1.005–1.030)
pH: 7 (ref 5.0–8.0)

## 2020-03-04 LAB — I-STAT BETA HCG BLOOD, ED (MC, WL, AP ONLY): I-stat hCG, quantitative: <5 m[IU]/mL (ref <5)

## 2020-03-04 MED ORDER — SODIUM CHLORIDE 0.9% FLUSH: 3.0000 mL | Freq: Once | INTRAVENOUS | Status: DC

## 2020-03-04 NOTE — ED Triage Notes (Addendum)
Pt c/o pain in RUQ was seen at [facility] this am .  Pt has been dx with gallstones and has an appointment next Wed for same.  Pt st's pain got worse tonight.  Pt denies any nausea or vomiting.  Pt triaged using Stratus Interpretor

## 2020-03-04 NOTE — ED Triage Notes (Signed)
Pt has gallstones, pt will see doctor next Wednesday for the issue.  Explained to go to emergency room for higher level of  Care.  Pt verbalized understanding.

## 2020-03-05 LAB — COMPREHENSIVE METABOLIC PANEL
ALT: 12 U/L (ref 0–44)
AST: 16 U/L (ref 15–41)
Albumin: 3.9 g/dL (ref 3.5–5.0)
Alkaline Phosphatase: 39 U/L (ref 38–126)
Anion gap: 9 (ref 5–15)
BUN: 10 mg/dL (ref 6–20)
CO2: 24 mmol/L (ref 22–32)
Calcium: 9.5 mg/dL (ref 8.9–10.3)
Chloride: 102 mmol/L (ref 98–111)
Creatinine, Ser: 0.77 mg/dL (ref 0.44–1.00)
GFR calc Af Amer: >60 mL/min (ref >60)
GFR calc non Af Amer: >60 mL/min (ref >60)
Glucose, Bld: 121 mg/dL — ABNORMAL HIGH (ref 70–99)
Potassium: 3.9 mmol/L (ref 3.5–5.1)
Sodium: 135 mmol/L (ref 135–145)
Total Bilirubin: 0.5 mg/dL (ref 0.3–1.2)
Total Protein: 7.4 g/dL (ref 6.5–8.1)

## 2020-03-05 LAB — LIPASE, BLOOD: Lipase: 41 U/L (ref 11–51)

## 2020-03-05 MED ORDER — ONDANSETRON 4 MG PO TBDP
8.0000 mg | ORAL_TABLET | Freq: Once | ORAL | Status: AC
  Administered 2020-03-05: 03:00:00 8 mg via ORAL
  Filled 2020-03-05: qty 2

## 2020-03-05 MED ORDER — HYDROCODONE-ACETAMINOPHEN 5-325 MG PO TABS
1.0000 | ORAL_TABLET | Freq: Once | ORAL | Status: AC
  Administered 2020-03-05: 03:00:00 1 via ORAL
  Filled 2020-03-05: qty 1

## 2020-03-05 MED ORDER — HYDROCODONE-ACETAMINOPHEN 5-325 MG PO TABS: 1.0000 | ORAL_TABLET | ORAL | 0 refills | Status: DC | PRN

## 2020-03-05 MED ORDER — ONDANSETRON HCL 4 MG PO TABS: 4.0000 mg | ORAL_TABLET | Freq: Four times a day (QID) | ORAL | 0 refills | Status: DC | PRN

## 2020-03-05 NOTE — ED Provider Notes (Signed)
[facility] EMERGENCY DEPARTMENT Provider Note   CSN: [id number] Arrival date & time: 03/04/20  2229     History Chief Complaint  Patient presents with  . Abdominal Pain    Pamela Stanley is a 44 y.o. female.  The history is provided by the patient. A language interpreter was used.  Abdominal Pain She has a known history of gallstones and comes in with right upper quadrant pain radiating to the back which started this morning after eating breakfast.  There is associated nausea but no vomiting.  She went to urgent care and was referred to the emergency department.  Pain is now subsiding, but is still present.  She is scheduled to see her surgeon in 3 days.  Past Medical History:  Diagnosis Date  . Gallstones   . GERD (gastroesophageal reflux disease)   . Ulcer     Patient Active Problem List   Diagnosis Date Noted  . Palpitations 01/17/2020  . History of COVID-19 01/17/2020  . Language barrier 01/03/2013    History reviewed. No pertinent surgical history.   OB History   No obstetric history on file.     Family History  Problem Relation Age of Onset  . Hyperlipidemia Mother   . Hypertension Mother   . Hypertension Father   . Hyperlipidemia Father     Social History   Tobacco Use  . Smoking status: Never Smoker  . Smokeless tobacco: Never Used  Substance Use Topics  . Alcohol use: No    Alcohol/week: 0.0 standard drinks  . Drug use: No    Home Medications Prior to Admission medications   Medication Sig Start Date End Date Taking? Authorizing Provider  Acetaminophen (TYLENOL PO) Take by mouth.    [provider]  hydrOXYzine (ATARAX/VISTARIL) 25 MG tablet Take 1 tablet (25 mg total) by mouth at bedtime as needed. 12/09/19   Georgina Quint, MD  lisinopril (ZESTRIL) 10 MG tablet Take 1 tablet (10 mg total) by mouth daily. Patient not taking: Reported on 01/17/2020 12/09/19   Georgina Quint, MD  naproxen (NAPROSYN) 375 MG  tablet Take 1 tablet (375 mg total) by mouth 2 (two) times daily. Patient not taking: Reported on 01/17/2020 08/13/19   Wurst, [place], PA-C  Pseudoeph-Doxylamine-DM-APAP (NYQUIL PO) Take by mouth daily.    [provider]  dicyclomine (BENTYL) 20 MG tablet Take 1 tablet (20 mg total) by mouth every 6 (six) hours as needed for spasms. 05/12/19 08/13/19  Georgina Quint, MD  pantoprazole (PROTONIX) 40 MG tablet Take 1 tablet by mouth daily. 06/17/19 08/13/19  [provider]    Allergies    Patient has no known allergies.  Review of Systems   Review of Systems  Gastrointestinal: Positive for abdominal pain.  All other systems reviewed and are negative.   Physical Exam Updated Vital Signs BP 120/86 (BP Location: Right Arm)   Pulse 72   Temp 98.5 F (36.9 C) (Oral)   Resp 16   Ht 5\' 1"  (1.549 m)   Wt 63 kg   LMP 01/30/2020 (Approximate)   SpO2 97%   BMI 26.26 kg/m   Physical Exam Vitals and nursing note reviewed.   44 year old female, resting comfortably and in no acute distress. Vital signs are normal. Oxygen saturation is 99%, which is normal. Head is normocephalic and atraumatic. PERRLA, EOMI. Oropharynx is clear. Neck is nontender and supple without adenopathy or JVD. Back is nontender and there is no CVA tenderness. Lungs  are clear without rales, wheezes, or rhonchi. Chest is nontender. Heart has regular rate and rhythm without murmur. Abdomen is soft, flat, with mild right upper quadrant tenderness.  There is negative Murphy sign.  There is no rebound or guarding.  There are no masses or hepatosplenomegaly and peristalsis is normoactive. Extremities have no cyanosis or edema, full range of motion is present. Skin is warm and dry without rash. Neurologic: Mental status is normal, cranial nerves are intact, there are no motor or sensory deficits.  ED Results / Procedures / Treatments   Labs (all labs ordered are listed, but only abnormal results are  displayed) Labs Reviewed  COMPREHENSIVE METABOLIC PANEL - Abnormal; Notable for the following components:      Result Value   Glucose, Bld 121 (*)    All other components within normal limits  URINALYSIS, ROUTINE W REFLEX MICROSCOPIC - Abnormal; Notable for the following components:   Color, Urine STRAW (*)    Leukocytes,Ua SMALL (*)    Bacteria, UA RARE (*)    All other components within normal limits  LIPASE, BLOOD  CBC  I-STAT BETA HCG BLOOD, ED ([facility], [facility], [facility] ONLY)   Procedures Procedures   Medications Ordered in ED Medications  sodium chloride flush (NS) 0.9 % injection 3 mL (has no administration in time range)  HYDROcodone-acetaminophen (NORCO/VICODIN) 5-325 MG per tablet 1 tablet (has no administration in time range)  ondansetron (ZOFRAN-ODT) disintegrating tablet 8 mg (has no administration in time range)    ED Course  I have reviewed the triage vital signs and the nursing notes.  Pertinent lab results that were available during my care of the patient were reviewed by me and considered in my medical decision making (see chart for details).  Biliary colic which is slowly resolving.  Labs are reassuring.  No elevation of transaminases, alkaline phosphatase, bilirubin.  WBC is normal.  Old records are reviewed, and ultrasound of abdomen last June and last August both showed evidence of cholelithiasis.  I have explained to the patient that her best solution is elective cholecystectomy.  She is to keep her appointment with her surgeon, given prescription for small number of hydrocodone-acetaminophen tablets to use as needed for pain, and also prescription for ondansetron.  Advised to return if pain is not being adequately controlled at home, or if she develops a fever. MDM Rules/Calculators/A&P  Final Clinical Impression(s) / ED Diagnoses Final diagnoses:  Biliary colic    Rx / DC Orders ED Discharge Orders         Ordered    ondansetron (ZOFRAN) 4 MG tablet  Every 6 hours  PRN     03/05/20 0254    HYDROcodone-acetaminophen (NORCO) 5-325 MG tablet  Every 4 hours PRN     03/05/20 0093           Delora Fuel, MD 81/82/99 [phone]

## 2020-03-05 NOTE — Discharge Instructions (Addendum)
Return if pain is not being adequately controlled at home. ?

## 2020-03-08 ENCOUNTER — Other Ambulatory Visit: Payer: Self-pay

## 2020-03-08 ENCOUNTER — Encounter (HOSPITAL_COMMUNITY): Payer: Self-pay | Admitting: Emergency Medicine

## 2020-03-08 ENCOUNTER — Observation Stay (HOSPITAL_COMMUNITY)
Admission: EM | Admit: 2020-03-08 | Discharge: 2020-03-10 | Disposition: A | Discharge status: Home / Self Care | Payer: BC Managed Care – PPO | Attending: Surgery | Admitting: Surgery

## 2020-03-08 ENCOUNTER — Emergency Department (HOSPITAL_COMMUNITY): Payer: BC Managed Care – PPO

## 2020-03-08 DIAGNOSIS — K8000 Calculus of gallbladder with acute cholecystitis without obstruction: Principal | ICD-10-CM | POA: Insufficient documentation

## 2020-03-08 DIAGNOSIS — K219 Gastro-esophageal reflux disease without esophagitis: Secondary | ICD-10-CM | POA: Diagnosis not present

## 2020-03-08 DIAGNOSIS — Z8616 Personal history of COVID-19: Secondary | ICD-10-CM | POA: Diagnosis not present

## 2020-03-08 DIAGNOSIS — I10 Essential (primary) hypertension: Secondary | ICD-10-CM | POA: Insufficient documentation

## 2020-03-08 DIAGNOSIS — R1011 Right upper quadrant pain: Secondary | ICD-10-CM

## 2020-03-08 DIAGNOSIS — K8020 Calculus of gallbladder without cholecystitis without obstruction: Secondary | ICD-10-CM | POA: Diagnosis present

## 2020-03-08 DIAGNOSIS — K760 Fatty (change of) liver, not elsewhere classified: Secondary | ICD-10-CM | POA: Diagnosis not present

## 2020-03-08 DIAGNOSIS — Z79899 Other long term (current) drug therapy: Secondary | ICD-10-CM | POA: Diagnosis not present

## 2020-03-08 LAB — CBC
HCT: 40.1 % (ref 36.0–46.0)
Hemoglobin: 13.2 g/dL (ref 12.0–15.0)
MCH: 29.8 pg (ref 26.0–34.0)
MCHC: 32.9 g/dL (ref 30.0–36.0)
MCV: 90.5 fL (ref 80.0–100.0)
Platelets: 325 10*3/uL (ref 150–400)
RBC: 4.43 MIL/uL (ref 3.87–5.11)
RDW: 11.7 % (ref 11.5–15.5)
WBC: 7.6 10*3/uL (ref 4.0–10.5)
nRBC: 0 % (ref 0.0–0.2)

## 2020-03-08 LAB — URINALYSIS, ROUTINE W REFLEX MICROSCOPIC
Bilirubin Urine: NEGATIVE
Glucose, UA: NEGATIVE mg/dL
Ketones, ur: NEGATIVE mg/dL
Leukocytes,Ua: NEGATIVE
Nitrite: NEGATIVE
Protein, ur: 30 mg/dL — AB
Specific Gravity, Urine: 1.025 (ref 1.005–1.030)
pH: 5 (ref 5.0–8.0)

## 2020-03-08 LAB — I-STAT BETA HCG BLOOD, ED (MC, WL, AP ONLY): I-stat hCG, quantitative: <5 m[IU]/mL (ref <5)

## 2020-03-08 LAB — COMPREHENSIVE METABOLIC PANEL
ALT: 12 U/L (ref 0–44)
AST: 16 U/L (ref 15–41)
Albumin: 3.8 g/dL (ref 3.5–5.0)
Alkaline Phosphatase: 42 U/L (ref 38–126)
Anion gap: 10 (ref 5–15)
BUN: 9 mg/dL (ref 6–20)
CO2: 26 mmol/L (ref 22–32)
Calcium: 9 mg/dL (ref 8.9–10.3)
Chloride: 102 mmol/L (ref 98–111)
Creatinine, Ser: 0.78 mg/dL (ref 0.44–1.00)
GFR calc Af Amer: >60 mL/min (ref >60)
GFR calc non Af Amer: >60 mL/min (ref >60)
Glucose, Bld: 157 mg/dL — ABNORMAL HIGH (ref 70–99)
Potassium: 3.6 mmol/L (ref 3.5–5.1)
Sodium: 138 mmol/L (ref 135–145)
Total Bilirubin: 0.5 mg/dL (ref 0.3–1.2)
Total Protein: 7.5 g/dL (ref 6.5–8.1)

## 2020-03-08 LAB — HIV ANTIBODY (ROUTINE TESTING W REFLEX): HIV Screen 4th Generation wRfx: NONREACTIVE

## 2020-03-08 LAB — SARS CORONAVIRUS 2 (TAT 6-24 HRS): SARS Coronavirus 2: NEGATIVE

## 2020-03-08 LAB — LIPASE, BLOOD: Lipase: 32 U/L (ref 11–51)

## 2020-03-08 MED ORDER — ONDANSETRON 4 MG PO TBDP
4.0000 mg | ORAL_TABLET | Freq: Once | ORAL | Status: AC | PRN
  Administered 2020-03-08: 4 mg via ORAL
  Filled 2020-03-08: qty 1

## 2020-03-08 MED ORDER — DIPHENHYDRAMINE HCL 50 MG/ML IJ SOLN: 25.0000 mg | Freq: Four times a day (QID) | INTRAMUSCULAR | Status: DC | PRN

## 2020-03-08 MED ORDER — SODIUM CHLORIDE 0.9 % IV SOLN
2.0000 g | INTRAVENOUS | Status: DC
  Administered 2020-03-08 – 2020-03-09 (×2): 2 g via INTRAVENOUS
  Filled 2020-03-08: qty 2
  Filled 2020-03-08 (×2): qty 20

## 2020-03-08 MED ORDER — DIPHENHYDRAMINE HCL 25 MG PO CAPS: 25.0000 mg | ORAL_CAPSULE | Freq: Four times a day (QID) | ORAL | Status: DC | PRN

## 2020-03-08 MED ORDER — ACETAMINOPHEN 650 MG RE SUPP: 650.0000 mg | Freq: Four times a day (QID) | RECTAL | Status: DC | PRN

## 2020-03-08 MED ORDER — GABAPENTIN 300 MG PO CAPS
300.0000 mg | ORAL_CAPSULE | ORAL | Status: AC
  Administered 2020-03-09: 11:00:00 300 mg via ORAL
  Filled 2020-03-08: qty 1

## 2020-03-08 MED ORDER — OXYCODONE-ACETAMINOPHEN 5-325 MG PO TABS
1.0000 | ORAL_TABLET | ORAL | Status: DC | PRN
  Administered 2020-03-08: 1 via ORAL
  Filled 2020-03-08: qty 1

## 2020-03-08 MED ORDER — KCL IN DEXTROSE-NACL 10-5-0.45 MEQ/L-%-% IV SOLN
INTRAVENOUS | Status: DC
  Administered 2020-03-08: 18:00:00 50 mL/h via INTRAVENOUS
  Filled 2020-03-08 (×3): qty 1000

## 2020-03-08 MED ORDER — ONDANSETRON 4 MG PO TBDP: 4.0000 mg | ORAL_TABLET | Freq: Four times a day (QID) | ORAL | Status: DC | PRN

## 2020-03-08 MED ORDER — KETOROLAC TROMETHAMINE 15 MG/ML IJ SOLN
15.0000 mg | INTRAMUSCULAR | Status: AC
  Administered 2020-03-09: 11:00:00 15 mg via INTRAVENOUS
  Filled 2020-03-08: qty 1

## 2020-03-08 MED ORDER — METOPROLOL TARTRATE 5 MG/5ML IV SOLN: 5.0000 mg | Freq: Four times a day (QID) | INTRAVENOUS | Status: DC | PRN

## 2020-03-08 MED ORDER — ONDANSETRON HCL 4 MG/2ML IJ SOLN
4.0000 mg | Freq: Four times a day (QID) | INTRAMUSCULAR | Status: DC | PRN
  Administered 2020-03-08 – 2020-03-09 (×3): 4 mg via INTRAVENOUS
  Filled 2020-03-08 (×3): qty 2

## 2020-03-08 MED ORDER — ENOXAPARIN SODIUM 40 MG/0.4ML ~~LOC~~ SOLN
40.0000 mg | SUBCUTANEOUS | Status: DC
  Administered 2020-03-08 – 2020-03-09 (×2): 40 mg via SUBCUTANEOUS
  Filled 2020-03-08: qty 0.4

## 2020-03-08 MED ORDER — OXYCODONE HCL 5 MG PO TABS
5.0000 mg | ORAL_TABLET | ORAL | Status: DC | PRN
  Administered 2020-03-08: 17:00:00 10 mg via ORAL
  Filled 2020-03-08: qty 2

## 2020-03-08 MED ORDER — SODIUM CHLORIDE 0.9 % IV BOLUS
1000.0000 mL | Freq: Once | INTRAVENOUS | Status: AC
  Administered 2020-03-08: 16:00:00 1000 mL via INTRAVENOUS

## 2020-03-08 MED ORDER — HYDROMORPHONE HCL 1 MG/ML IJ SOLN
0.5000 mg | INTRAMUSCULAR | Status: DC | PRN
  Administered 2020-03-08 – 2020-03-09 (×4): 1 mg via INTRAVENOUS
  Filled 2020-03-08 (×5): qty 1

## 2020-03-08 MED ORDER — ACETAMINOPHEN 500 MG PO TABS
1000.0000 mg | ORAL_TABLET | ORAL | Status: AC
  Administered 2020-03-09: 1000 mg via ORAL
  Filled 2020-03-08: qty 2

## 2020-03-08 MED ORDER — METOCLOPRAMIDE HCL 5 MG/ML IJ SOLN
10.0000 mg | Freq: Once | INTRAMUSCULAR | Status: AC
  Administered 2020-03-08: 21:00:00 10 mg via INTRAVENOUS
  Filled 2020-03-08: qty 2

## 2020-03-08 MED ORDER — MORPHINE SULFATE (PF) 4 MG/ML IV SOLN
4.0000 mg | Freq: Once | INTRAVENOUS | Status: AC
  Administered 2020-03-08: 4 mg via INTRAVENOUS
  Filled 2020-03-08: qty 1

## 2020-03-08 MED ORDER — ACETAMINOPHEN 325 MG PO TABS
650.0000 mg | ORAL_TABLET | Freq: Four times a day (QID) | ORAL | Status: DC | PRN
  Administered 2020-03-09 – 2020-03-10 (×2): 650 mg via ORAL
  Filled 2020-03-08 (×2): qty 2

## 2020-03-08 NOTE — H&P (Signed)
[facility] Admission Note  Pamela Stanley Jun 15, 1976  [id number].    Requesting MD: Dietrich Pates PA-C Chief Complaint/Reason for Consult: symptomatic cholelithiasis  HPI:  Patient is a 44 year old female who presented to [facility] with recurrent RUQ pain. Patient reports pain started 1 week ago and is in the RUQ, radiating to the right back. She was seen in the ED 2 days ago for the same thing and found to have cholelithiasis without cholecystitis. Tried taking prescribed pain medications without relief. Associated nausea and bilious emesis. Denies fever, chills, chest pain, SOB, diarrhea, urinary symptoms. She was scheduled to follow up as an outpatient with surgeon today, but her appointment had to be rescheduled to next week. PMH otherwise significant for GERD, HTN. She does not take any daily medications currently. NKDA. No past abdominal surgery. She denies alcohol, tobacco or illicit drug use. She works as a Agricultural engineer. Patient speaks vietnamese, but her husband is at the bedside and translating for her.   ROS: Review of Systems  Constitutional: Negative for chills and fever.  Respiratory: Negative for shortness of breath.   Cardiovascular: Negative for chest pain.  Gastrointestinal: Positive for abdominal pain, nausea and vomiting. Negative for constipation and diarrhea.  Genitourinary: Negative for dysuria, frequency and urgency.  All other systems reviewed and are negative.   Family History  Problem Relation Age of Onset  . Hyperlipidemia Mother   . Hypertension Mother   . Hypertension Father   . Hyperlipidemia Father     Past Medical History:  Diagnosis Date  . Gallstones   . GERD (gastroesophageal reflux disease)   . Ulcer     History reviewed. No pertinent surgical history.  Social History:  reports that she has never smoked. She has never used smokeless tobacco. She reports that she does not drink alcohol or use drugs.  Allergies: No Known Allergies  (Not  in a hospital admission)   Blood pressure 129/67, pulse 83, temperature 97.9 F (36.6 C), temperature source Oral, resp. rate 16, height 5\' 1"  (1.549 m), weight 63 kg, SpO2 100 %. Physical Exam:  General: pleasant, WD, WN  female who is laying in bed in NAD HEENT: Sclera are anicteric.  PERRL.  Ears and nose without any masses or lesions.  Mouth is pink and moist Heart: regular, rate, and rhythm.  Normal s1,s2. No obvious murmurs, gallops, or rubs noted.  Palpable radial and pedal pulses bilaterally Lungs: CTAB, no wheezes, rhonchi, or rales noted.  Respiratory effort nonlabored Abd: soft, TTP in RUQ, ND, +BS, no masses, hernias, or organomegaly MS: all 4 extremities are symmetrical with no cyanosis, clubbing, or edema. Skin: warm and dry with no masses, lesions, or rashes Neuro: Cranial nerves 2-12 grossly intact, sensation grossly intact throughout Psych: A&Ox3 with an appropriate affect.   Results for orders placed or performed during the hospital encounter of 03/08/20 (from the past 48 hour(s))  Urinalysis, Routine w reflex microscopic     Status: Abnormal   Collection Time: 03/08/20 11:15 AM  Result Value Ref Range   Color, Urine YELLOW YELLOW   APPearance HAZY (A) CLEAR   Specific Gravity, Urine 1.025 1.005 - 1.030   pH 5.0 5.0 - 8.0   Glucose, UA NEGATIVE NEGATIVE mg/dL   Hgb urine dipstick LARGE (A) NEGATIVE   Bilirubin Urine NEGATIVE NEGATIVE   Ketones, ur NEGATIVE NEGATIVE mg/dL   Protein, ur 30 (A) NEGATIVE mg/dL   Nitrite NEGATIVE NEGATIVE   Leukocytes,Ua NEGATIVE NEGATIVE   RBC / HPF  21-50 0 - 5 RBC/hpf   WBC, UA 0-5 0 - 5 WBC/hpf   Bacteria, UA RARE (A) NONE SEEN   Squamous Epithelial / LPF 6-10 0 - 5   Mucus PRESENT    Hyaline Casts, UA PRESENT     Comment: Performed at [facility], [place] [address], [place], [place] [address]  Lipase, blood     Status: None   Collection Time: 03/08/20 11:25 AM  Result Value Ref Range   Lipase 32 11 - 51 U/L    Comment:  Performed at [facility] [address] [place], [place] [address]  Comprehensive metabolic panel     Status: Abnormal   Collection Time: 03/08/20 11:25 AM  Result Value Ref Range   Sodium 138 135 - 145 mmol/L   Potassium 3.6 3.5 - 5.1 mmol/L   Chloride 102 98 - 111 mmol/L   CO2 26 22 - 32 mmol/L   Glucose, Bld 157 (H) 70 - 99 mg/dL    Comment: Glucose reference range applies only to samples taken after fasting for at least 8 hours.   BUN 9 6 - 20 mg/dL   Creatinine, Ser 0.78 0.44 - 1.00 mg/dL   Calcium 9.0 8.9 - 10.3 mg/dL   Total Protein 7.5 6.5 - 8.1 g/dL   Albumin 3.8 3.5 - 5.0 g/dL   AST 16 15 - 41 U/L   ALT 12 0 - 44 U/L   Alkaline Phosphatase 42 38 - 126 U/L   Total Bilirubin 0.5 0.3 - 1.2 mg/dL   GFR calc non Af Amer >60 >60 mL/min   GFR calc Af Amer >60 >60 mL/min   Anion gap 10 5 - 15    Comment: Performed at [facility], [place] [address], [place] [address]  CBC     Status: None   Collection Time: 03/08/20 11:25 AM  Result Value Ref Range   WBC 7.6 4.0 - 10.5 K/uL   RBC 4.43 3.87 - 5.11 MIL/uL   Hemoglobin 13.2 12.0 - 15.0 g/dL   HCT 40.1 36.0 - 46.0 %   MCV 90.5 80.0 - 100.0 fL   MCH 29.8 26.0 - 34.0 pg   MCHC 32.9 30.0 - 36.0 g/dL   RDW 11.7 11.5 - 15.5 %   Platelets 325 150 - 400 K/uL   nRBC 0.0 0.0 - 0.2 %    Comment: Performed at [facility], [place] [address] [place], [place] [address]  I-Stat beta hCG blood, ED     Status: None   Collection Time: 03/08/20 11:50 AM  Result Value Ref Range   I-stat hCG, quantitative <5.0 <5 mIU/mL   Comment 3            Comment:   GEST. AGE      CONC.  (mIU/mL)   <=1 WEEK        5 - 50     2 WEEKS       50 - 500     3 WEEKS       100 - 10,000     4 WEEKS     1,000 - 30,000        FEMALE AND NON-PREGNANT FEMALE:     LESS THAN 5 mIU/mL    US Abdomen Limited RUQ  Result Date: 03/08/2020 CLINICAL DATA:  Right upper quadrant pain. EXAM: ULTRASOUND ABDOMEN LIMITED RIGHT UPPER QUADRANT COMPARISON:   Right upper quadrant ultrasound dated July 15, 2019.  FINDINGS: Gallbladder: 9 mm calculus again identified in the gallbladder neck. Sludge is present. No wall thickening visualized. No sonographic Murphy sign noted by sonographer. Common bile duct: Diameter: 6 mm, normal. Liver: No focal lesion identified. Unchanged increased parenchymal echogenicity. Fatty sparing along the gallbladder fossa. Portal vein is patent on color Doppler imaging with normal direction of blood flow towards the liver. Other: None. IMPRESSION: 1. Similar appearing cholelithiasis without sonographic evidence of acute cholecystitis. 2. Unchanged hepatic steatosis. Electronically Signed   By: Obie Dredge M.D.   On: 03/08/2020 16:09      Assessment/Plan GERD Hx of HTN - prn lopressor   Symptomatic cholelithiasis Possible cholecystitis - US shows stone in the gallbladder neck - WBC and LFTs WNL - TTP in RUQ on exam and pain not improved with pain medications in the ED - will admit to observation - likely to OR tomorrow for laparoscopic cholecystectomy   FEN: CLD, IVF - NPO after MN VTE: SCDs, lovenox ID: [email or web address] 4/7 ordered   Juliet Rude, [facility] 03/08/2020, 4:52 PM Please see Amion for pager number during day hours 7:00am-4:30pm

## 2020-03-08 NOTE — ED Triage Notes (Signed)
Pt with RUQ pain, stating she has gallstones. States she took the prescribed meds with no relief. Pt states she had a follow-up appt today but they cancelled on her.

## 2020-03-08 NOTE — ED Provider Notes (Signed)
[facility] EMERGENCY DEPARTMENT Provider Note   CSN: [id number] Arrival date & time: 03/08/20  1104     History Chief Complaint  Patient presents with  . Abdominal Pain    Pamela Stanley is a 44 y.o. female with a past medical history of GERD, cholelithiasis presenting to the ED with a chief complaint of right upper quadrant abdominal pain.  Reports 1 week history of constant right upper quadrant pain that has not been controlled with her pain medications prescribed to her at her visit 2 days ago.  She was also given antiemetics but continues to have vomiting.  She was told to follow-up with the surgeon.  She was scheduled for an appointment today but they called her and told her that they needed to cancel.  Next available appointment is for next week but patient continues to be symptomatic so she presented back to the ED.  She denies any sick contacts with similar symptoms.  She denies any urinary symptoms, lower abdominal pain, fever, back pain, possibility of pregnancy.  Patient declined medical translator, her husband at the bedside is translating.  HPI     Past Medical History:  Diagnosis Date  . Gallstones   . GERD (gastroesophageal reflux disease)   . Ulcer     Patient Active Problem List   Diagnosis Date Noted  . Palpitations 01/17/2020  . History of COVID-19 01/17/2020  . Language barrier 01/03/2013    History reviewed. No pertinent surgical history.   OB History   No obstetric history on file.     Family History  Problem Relation Age of Onset  . Hyperlipidemia Mother   . Hypertension Mother   . Hypertension Father   . Hyperlipidemia Father     Social History   Tobacco Use  . Smoking status: Never Smoker  . Smokeless tobacco: Never Used  Substance Use Topics  . Alcohol use: No    Alcohol/week: 0.0 standard drinks  . Drug use: No    Home Medications Prior to Admission medications   Medication Sig Start Date End Date Taking?  Authorizing Provider  Acetaminophen (TYLENOL PO) Take by mouth.    [provider]  HYDROcodone-acetaminophen (NORCO) 5-325 MG tablet Take 1 tablet by mouth every 4 (four) hours as needed for moderate pain. 03/05/20   Dione Booze, MD  hydrOXYzine (ATARAX/VISTARIL) 25 MG tablet Take 1 tablet (25 mg total) by mouth at bedtime as needed. 12/09/19   Georgina Quint, MD  lisinopril (ZESTRIL) 10 MG tablet Take 1 tablet (10 mg total) by mouth daily. Patient not taking: Reported on 01/17/2020 12/09/19   Georgina Quint, MD  naproxen (NAPROSYN) 375 MG tablet Take 1 tablet (375 mg total) by mouth 2 (two) times daily. Patient not taking: Reported on 01/17/2020 08/13/19   Wurst, [place], PA-C  ondansetron (ZOFRAN) 4 MG tablet Take 1 tablet (4 mg total) by mouth every 6 (six) hours as needed for nausea or vomiting. 03/05/20   Dione Booze, MD  Pseudoeph-Doxylamine-DM-APAP (NYQUIL PO) Take by mouth daily.    [provider]  dicyclomine (BENTYL) 20 MG tablet Take 1 tablet (20 mg total) by mouth every 6 (six) hours as needed for spasms. 05/12/19 08/13/19  Georgina Quint, MD  pantoprazole (PROTONIX) 40 MG tablet Take 1 tablet by mouth daily. 06/17/19 08/13/19  [provider]    Allergies    Patient has no known allergies.  Review of Systems   Review of Systems  Constitutional: Negative for appetite  change, chills and fever.  HENT: Negative for ear pain, rhinorrhea, sneezing and sore throat.   Eyes: Negative for photophobia and visual disturbance.  Respiratory: Negative for cough, chest tightness, shortness of breath and wheezing.   Cardiovascular: Negative for chest pain and palpitations.  Gastrointestinal: Positive for abdominal pain, nausea and vomiting. Negative for blood in stool, constipation and diarrhea.  Genitourinary: Negative for dysuria, hematuria and urgency.  Musculoskeletal: Negative for myalgias.  Skin: Negative for rash.  Neurological: Negative for  dizziness, weakness and light-headedness.    Physical Exam Updated Vital Signs BP 129/67 (BP Location: Right Arm)   Pulse 83   Temp 97.9 F (36.6 C) (Oral)   Resp 16   Ht 5\' 1"  (1.549 m)   Wt 63 kg   SpO2 100%   BMI 26.24 kg/m   Physical Exam Vitals and nursing note reviewed.  Constitutional:      General: She is not in acute distress.    Appearance: She is well-developed.     Comments: Appears uncomfortable.  HENT:     Head: Normocephalic and atraumatic.     Nose: Nose normal.  Eyes:     General: No scleral icterus.       Left eye: No discharge.     Conjunctiva/sclera: Conjunctivae normal.  Cardiovascular:     Rate and Rhythm: Normal rate and regular rhythm.     Heart sounds: Normal heart sounds. No murmur. No friction rub. No gallop.   Pulmonary:     Effort: Pulmonary effort is normal. No respiratory distress.     Breath sounds: Normal breath sounds.  Abdominal:     General: Bowel sounds are normal. There is no distension.     Palpations: Abdomen is soft.     Tenderness: There is abdominal tenderness in the right upper quadrant and epigastric area. There is no guarding.  Musculoskeletal:        General: Normal range of motion.     Cervical back: Normal range of motion and neck supple.  Skin:    General: Skin is warm and dry.     Findings: No rash.  Neurological:     Mental Status: She is alert.     Motor: No abnormal muscle tone.     Coordination: Coordination normal.     ED Results / Procedures / Treatments   Labs (all labs ordered are listed, but only abnormal results are displayed) Labs Reviewed  COMPREHENSIVE METABOLIC PANEL - Abnormal; Notable for the following components:      Result Value   Glucose, Bld 157 (*)    All other components within normal limits  URINALYSIS, ROUTINE W REFLEX MICROSCOPIC - Abnormal; Notable for the following components:   APPearance HAZY (*)    Hgb urine dipstick LARGE (*)    Protein, ur 30 (*)    Bacteria, UA RARE (*)     All other components within normal limits  SARS CORONAVIRUS 2 (TAT 6-24 HRS)  LIPASE, BLOOD  CBC  I-STAT BETA HCG BLOOD, ED ([facility], [facility], [facility] ONLY)    EKG None  Radiology US Abdomen Limited RUQ  Result Date: 03/08/2020 CLINICAL DATA:  Right upper quadrant pain. EXAM: ULTRASOUND ABDOMEN LIMITED RIGHT UPPER QUADRANT COMPARISON:  Right upper quadrant ultrasound dated July 15, 2019. FINDINGS: Gallbladder: 9 mm calculus again identified in the gallbladder neck. Sludge is present. No wall thickening visualized. No sonographic Murphy sign noted by sonographer. Common bile duct: Diameter: 6 mm, normal. Liver: No focal lesion identified. Unchanged increased  parenchymal echogenicity. Fatty sparing along the gallbladder fossa. Portal vein is patent on color Doppler imaging with normal direction of blood flow towards the liver. Other: None. IMPRESSION: 1. Similar appearing cholelithiasis without sonographic evidence of acute cholecystitis. 2. Unchanged hepatic steatosis. Electronically Signed   By: Obie Dredge M.D.   On: 03/08/2020 16:09    Procedures Procedures (including critical care time)  Medications Ordered in ED Medications  ondansetron (ZOFRAN-ODT) disintegrating tablet 4 mg (4 mg Oral Given 03/08/20 1354)  sodium chloride 0.9 % bolus 1,000 mL (1,000 mLs Intravenous New Bag/Given 03/08/20 1545)  morphine 4 MG/ML injection 4 mg (4 mg Intravenous Given 03/08/20 1545)    ED Course  I have reviewed the triage vital signs and the nursing notes.  Pertinent labs & imaging results that were available during my care of the patient were reviewed by me and considered in my medical decision making (see chart for details).    MDM Rules/Calculators/A&P                      44 year old female with a past medical history of GERD, cholelithiasis presenting to the ED with a chief complaint of continued right upper quadrant abdominal pain.  Reports 1 week history of constant right upper quadrant pain that  is not been controlled with pain medications or antiemetics prescribed to her at her visit 2 days ago.  She continues to be symptomatic.  She was told in the past that she had gallstones, try to follow-up with the surgeon this afternoon but they canceled the appointment and her next earliest appointment would be next week.  She is concerned because her symptoms have worsened.  She denies any lower abdominal discomfort, urinary symptoms, fever or chest pain.  On my exam there is tenderness palpation of the right upper quadrant without rebound or guarding.  She is afebrile without recent use of antipyretics.  Normal LFTs and lipase noted on today's exam.  No leukocytosis.  hCG is negative.  Right upper quadrant ultrasound again redemonstrates 9 mm calculus in the gallbladder neck.  I feel this is the cause of her symptoms today.  As this is her third visit for similar symptoms, feel that she may benefit from general surgery consult for possible cholecystectomy.  I spoke to general surgery PA who will see the patient in consult for likely admission, for possible OR tomorrow.  Covid test has been ordered.  Final Clinical Impression(s) / ED Diagnoses Final diagnoses:  RUQ abdominal pain  Calculus of gallbladder without cholecystitis without obstruction    Rx / DC Orders ED Discharge Orders    None      Portions of this note were generated with Dragon dictation software. Dictation errors may occur despite best attempts at proofreading.    Dietrich Pates, PA-C 03/08/20 1635    Mancel Bale, MD 03/09/20 2151

## 2020-03-09 ENCOUNTER — Observation Stay (HOSPITAL_COMMUNITY): Payer: BC Managed Care – PPO | Admitting: Certified Registered Nurse Anesthetist

## 2020-03-09 ENCOUNTER — Encounter (HOSPITAL_COMMUNITY)
Admission: EM | Disposition: A | Discharge status: Home / Self Care | Payer: Self-pay | Source: Home / Self Care | Attending: Emergency Medicine

## 2020-03-09 ENCOUNTER — Encounter (HOSPITAL_COMMUNITY): Payer: Self-pay

## 2020-03-09 HISTORY — PX: CHOLECYSTECTOMY: SHX55

## 2020-03-09 LAB — COMPREHENSIVE METABOLIC PANEL
ALT: 12 U/L (ref 0–44)
AST: 15 U/L (ref 15–41)
Albumin: 3.6 g/dL (ref 3.5–5.0)
Alkaline Phosphatase: 38 U/L (ref 38–126)
Anion gap: 12 (ref 5–15)
BUN: 5 mg/dL — ABNORMAL LOW (ref 6–20)
CO2: 22 mmol/L (ref 22–32)
Calcium: 8.6 mg/dL — ABNORMAL LOW (ref 8.9–10.3)
Chloride: 103 mmol/L (ref 98–111)
Creatinine, Ser: 0.62 mg/dL (ref 0.44–1.00)
GFR calc Af Amer: >60 mL/min (ref >60)
GFR calc non Af Amer: >60 mL/min (ref >60)
Glucose, Bld: 145 mg/dL — ABNORMAL HIGH (ref 70–99)
Potassium: 3.7 mmol/L (ref 3.5–5.1)
Sodium: 137 mmol/L (ref 135–145)
Total Bilirubin: 0.1 mg/dL — ABNORMAL LOW (ref 0.3–1.2)
Total Protein: 6.8 g/dL (ref 6.5–8.1)

## 2020-03-09 LAB — CBC
HCT: 38.3 % (ref 36.0–46.0)
Hemoglobin: 12.7 g/dL (ref 12.0–15.0)
MCH: 29.9 pg (ref 26.0–34.0)
MCHC: 33.2 g/dL (ref 30.0–36.0)
MCV: 90.1 fL (ref 80.0–100.0)
Platelets: 296 10*3/uL (ref 150–400)
RBC: 4.25 MIL/uL (ref 3.87–5.11)
RDW: 11.9 % (ref 11.5–15.5)
WBC: 11.4 10*3/uL — ABNORMAL HIGH (ref 4.0–10.5)
nRBC: 0 % (ref 0.0–0.2)

## 2020-03-09 LAB — SURGICAL PCR SCREEN
MRSA, PCR: NEGATIVE
Staphylococcus aureus: NEGATIVE

## 2020-03-09 SURGERY — LAPAROSCOPIC CHOLECYSTECTOMY
Anesthesia: General | Site: Abdomen

## 2020-03-09 MED ORDER — DEXAMETHASONE SODIUM PHOSPHATE 10 MG/ML IJ SOLN
INTRAMUSCULAR | Status: AC
  Filled 2020-03-09: qty 1

## 2020-03-09 MED ORDER — ONDANSETRON HCL 4 MG/2ML IJ SOLN
INTRAMUSCULAR | Status: AC
  Filled 2020-03-09: qty 2

## 2020-03-09 MED ORDER — FENTANYL CITRATE (PF) 250 MCG/5ML IJ SOLN
INTRAMUSCULAR | Status: DC | PRN
  Administered 2020-03-09: 50 ug via INTRAVENOUS
  Administered 2020-03-09 (×2): 100 ug via INTRAVENOUS

## 2020-03-09 MED ORDER — SODIUM CHLORIDE 0.9 % IR SOLN
Status: DC | PRN
  Administered 2020-03-09: 1000 mL

## 2020-03-09 MED ORDER — BUPIVACAINE-EPINEPHRINE 0.25% -1:200000 IJ SOLN
INTRAMUSCULAR | Status: DC | PRN
  Administered 2020-03-09: 25 mL

## 2020-03-09 MED ORDER — ONDANSETRON HCL 4 MG/2ML IJ SOLN
INTRAMUSCULAR | Status: DC | PRN
  Administered 2020-03-09: 4 mg via INTRAVENOUS

## 2020-03-09 MED ORDER — LIDOCAINE 2% (20 MG/ML) 5 ML SYRINGE
INTRAMUSCULAR | Status: AC
  Filled 2020-03-09: qty 5

## 2020-03-09 MED ORDER — SUGAMMADEX SODIUM 200 MG/2ML IV SOLN
INTRAVENOUS | Status: DC | PRN
  Administered 2020-03-09: 200 mg via INTRAVENOUS

## 2020-03-09 MED ORDER — PROPOFOL 1000 MG/100ML IV EMUL
INTRAVENOUS | Status: AC
  Filled 2020-03-09: qty 100

## 2020-03-09 MED ORDER — CEFAZOLIN SODIUM-DEXTROSE 2-3 GM-%(50ML) IV SOLR
INTRAVENOUS | Status: DC | PRN
  Administered 2020-03-09: 2 g via INTRAVENOUS

## 2020-03-09 MED ORDER — MIDAZOLAM HCL 2 MG/2ML IJ SOLN
INTRAMUSCULAR | Status: AC
  Filled 2020-03-09: qty 2

## 2020-03-09 MED ORDER — DIPHENHYDRAMINE HCL 50 MG/ML IJ SOLN
INTRAMUSCULAR | Status: DC | PRN
  Administered 2020-03-09: 12.5 mg via INTRAVENOUS

## 2020-03-09 MED ORDER — MIDAZOLAM HCL 2 MG/2ML IJ SOLN
INTRAMUSCULAR | Status: DC | PRN
  Administered 2020-03-09: 2 mg via INTRAVENOUS

## 2020-03-09 MED ORDER — TRAMADOL HCL 50 MG PO TABS
50.0000 mg | ORAL_TABLET | Freq: Four times a day (QID) | ORAL | Status: DC | PRN
  Administered 2020-03-09 – 2020-03-10 (×2): 50 mg via ORAL
  Filled 2020-03-09 (×2): qty 1

## 2020-03-09 MED ORDER — BUPIVACAINE HCL (PF) 0.25 % IJ SOLN
INTRAMUSCULAR | Status: AC
  Filled 2020-03-09: qty 30

## 2020-03-09 MED ORDER — ROCURONIUM BROMIDE 10 MG/ML (PF) SYRINGE
PREFILLED_SYRINGE | INTRAVENOUS | Status: AC
  Filled 2020-03-09: qty 10

## 2020-03-09 MED ORDER — SUCCINYLCHOLINE CHLORIDE 200 MG/10ML IV SOSY
PREFILLED_SYRINGE | INTRAVENOUS | Status: DC | PRN
  Administered 2020-03-09: 100 mg via INTRAVENOUS

## 2020-03-09 MED ORDER — CEFAZOLIN SODIUM 1 G IJ SOLR
INTRAMUSCULAR | Status: AC
  Filled 2020-03-09: qty 20

## 2020-03-09 MED ORDER — PROPOFOL 10 MG/ML IV BOLUS
INTRAVENOUS | Status: DC | PRN
  Administered 2020-03-09: 170 mg via INTRAVENOUS

## 2020-03-09 MED ORDER — LACTATED RINGERS IV SOLN: INTRAVENOUS | Status: DC

## 2020-03-09 MED ORDER — DIPHENHYDRAMINE HCL 50 MG/ML IJ SOLN
INTRAMUSCULAR | Status: AC
  Filled 2020-03-09: qty 1

## 2020-03-09 MED ORDER — PROPOFOL 10 MG/ML IV BOLUS
INTRAVENOUS | Status: AC
  Filled 2020-03-09: qty 20

## 2020-03-09 MED ORDER — FENTANYL CITRATE (PF) 250 MCG/5ML IJ SOLN
INTRAMUSCULAR | Status: AC
  Filled 2020-03-09: qty 5

## 2020-03-09 MED ORDER — LIDOCAINE 2% (20 MG/ML) 5 ML SYRINGE
INTRAMUSCULAR | Status: DC | PRN
  Administered 2020-03-09: 40 mg via INTRAVENOUS

## 2020-03-09 MED ORDER — SUCCINYLCHOLINE CHLORIDE 200 MG/10ML IV SOSY
PREFILLED_SYRINGE | INTRAVENOUS | Status: AC
  Filled 2020-03-09: qty 10

## 2020-03-09 MED ORDER — 0.9 % SODIUM CHLORIDE (POUR BTL) OPTIME
TOPICAL | Status: DC | PRN
  Administered 2020-03-09: 1000 mL

## 2020-03-09 MED ORDER — PHENOL 1.4 % MT LIQD
2.0000 | OROMUCOSAL | Status: DC | PRN
  Filled 2020-03-09: qty 177

## 2020-03-09 MED ORDER — DEXAMETHASONE SODIUM PHOSPHATE 10 MG/ML IJ SOLN
INTRAMUSCULAR | Status: DC | PRN
  Administered 2020-03-09: 8 mg via INTRAVENOUS

## 2020-03-09 MED ORDER — ROCURONIUM BROMIDE 10 MG/ML (PF) SYRINGE
PREFILLED_SYRINGE | INTRAVENOUS | Status: DC | PRN
  Administered 2020-03-09: 40 mg via INTRAVENOUS

## 2020-03-09 SURGICAL SUPPLY — 37 items
APPLIER CLIP 5 13 M/L LIGAMAX5 (MISCELLANEOUS) ×3
CANISTER SUCT 3000ML PPV (MISCELLANEOUS) ×3 IMPLANT
CHLORAPREP W/TINT 26 (MISCELLANEOUS) ×3 IMPLANT
CLIP APPLIE 5 13 M/L LIGAMAX5 (MISCELLANEOUS) ×2 IMPLANT
CNTNR URN SCR LID CUP LEK RST (MISCELLANEOUS) ×2 IMPLANT
CONT SPEC 4OZ STRL OR WHT (MISCELLANEOUS) ×1
COVER MAYO STAND STRL (DRAPES) ×3 IMPLANT
COVER SURGICAL LIGHT HANDLE (MISCELLANEOUS) ×3 IMPLANT
COVER WAND RF STERILE (DRAPES) IMPLANT
DERMABOND ADVANCED (GAUZE/BANDAGES/DRESSINGS) ×1
DERMABOND ADVANCED .7 DNX12 (GAUZE/BANDAGES/DRESSINGS) ×2 IMPLANT
DRAPE C-ARM 42X120 X-RAY (DRAPES) IMPLANT
ELECT REM PT RETURN 9FT ADLT (ELECTROSURGICAL) ×3
ELECTRODE REM PT RTRN 9FT ADLT (ELECTROSURGICAL) ×2 IMPLANT
GLOVE BIO SURGEON STRL SZ 6 (GLOVE) ×3 IMPLANT
GLOVE INDICATOR 6.5 STRL GRN (GLOVE) ×3 IMPLANT
GOWN STRL REUS W/ TWL LRG LVL3 (GOWN DISPOSABLE) ×6 IMPLANT
GOWN STRL REUS W/TWL LRG LVL3 (GOWN DISPOSABLE) ×3
GRASPER SUT TROCAR 14GX15 (MISCELLANEOUS) ×3 IMPLANT
KIT BASIN OR (CUSTOM PROCEDURE TRAY) ×3 IMPLANT
KIT TURNOVER KIT B (KITS) ×3 IMPLANT
NEEDLE INSUFFLATION 14GA 120MM (NEEDLE) ×3 IMPLANT
NS IRRIG 1000ML POUR BTL (IV SOLUTION) ×3 IMPLANT
PAD ARMBOARD 7.5X6 YLW CONV (MISCELLANEOUS) ×3 IMPLANT
POUCH SPECIMEN RETRIEVAL 10MM (ENDOMECHANICALS) ×3 IMPLANT
SCISSORS LAP 5X35 DISP (ENDOMECHANICALS) ×3 IMPLANT
SET CHOLANGIOGRAPH 5 50 .035 (SET/KITS/TRAYS/PACK) IMPLANT
SET IRRIG TUBING LAPAROSCOPIC (IRRIGATION / IRRIGATOR) ×3 IMPLANT
SET TUBE SMOKE EVAC HIGH FLOW (TUBING) ×3 IMPLANT
SLEEVE ENDOPATH XCEL 5M (ENDOMECHANICALS) ×3 IMPLANT
SUT MNCRL AB 4-0 PS2 18 (SUTURE) ×3 IMPLANT
TOWEL GREEN STERILE (TOWEL DISPOSABLE) ×3 IMPLANT
TOWEL GREEN STERILE FF (TOWEL DISPOSABLE) ×3 IMPLANT
TRAY LAPAROSCOPIC MC (CUSTOM PROCEDURE TRAY) ×3 IMPLANT
TROCAR XCEL NON-BLD 11X100MML (ENDOMECHANICALS) ×3 IMPLANT
TROCAR XCEL NON-BLD 5MMX100MML (ENDOMECHANICALS) ×3 IMPLANT
WATER STERILE IRR 1000ML POUR (IV SOLUTION) ×3 IMPLANT

## 2020-03-09 NOTE — Anesthesia Postprocedure Evaluation (Signed)
Anesthesia Post Note  Patient: Pamela Stanley  Procedure(s) Performed: LAPAROSCOPIC CHOLECYSTECTOMY (N/A Abdomen)     Patient location during evaluation: PACU Anesthesia Type: General Level of consciousness: awake and alert Pain management: pain level controlled Vital Signs Assessment: post-procedure vital signs reviewed and stable Respiratory status: spontaneous breathing, nonlabored ventilation, respiratory function stable and patient connected to nasal cannula oxygen Cardiovascular status: blood pressure returned to baseline and stable Postop Assessment: no apparent nausea or vomiting Anesthetic complications: no    Last Vitals:  Vitals:   03/09/20 1312 03/09/20 1324  BP: 138/88 126/84  Pulse: (!) 104 82  Resp: 18 16  Temp:  36.8 C  SpO2: 100% 100%    Last Pain:  Vitals:   03/09/20 1737  TempSrc:   PainSc: 5                  Burnadette Baskett COKER

## 2020-03-09 NOTE — Transfer of Care (Signed)
Immediate Anesthesia Transfer of Care Note  Patient: Pamela Stanley  Procedure(s) Performed: LAPAROSCOPIC CHOLECYSTECTOMY (N/A Abdomen)  Patient Location: PACU  Anesthesia Type:General  Level of Consciousness: drowsy  Airway & Oxygen Therapy: Patient Spontanous Breathing and Patient connected to face mask oxygen, OPA  Post-op Assessment: Report given to RN and Post -op Vital signs reviewed and stable  Post vital signs: Reviewed and stable  Last Vitals:  Vitals Value Taken Time  BP 118/80 03/09/20 1241  Temp    Pulse 86 03/09/20 1244  Resp 22 03/09/20 1244  SpO2 99 % 03/09/20 1244  Vitals shown include unvalidated device data.  Last Pain:  Vitals:   03/09/20 0952  TempSrc:   PainSc: 7       Patients Stated Pain Goal: 0 (03/09/20 0745)  Complications: No apparent anesthesia complications

## 2020-03-09 NOTE — Discharge Instructions (Signed)
[facility] LAPAROSCOPIC SURGERY: POST OP INSTRUCTIONS Always review your discharge instruction sheet given to you by the facility where your surgery was performed. IF YOU HAVE DISABILITY OR FAMILY LEAVE FORMS, YOU MUST BRING THEM TO THE OFFICE FOR PROCESSING.   DO NOT GIVE THEM TO YOUR DOCTOR.  PAIN CONTROL  1. First take acetaminophen (Tylenol) AND/or ibuprofen (Advil) to control your pain after surgery.  Follow directions on package.  Taking acetaminophen (Tylenol) and/or ibuprofen (Advil) regularly after surgery will help to control your pain and lower the amount of prescription pain medication you may need.  You should not take more than 3,000 mg (3 grams) of acetaminophen (Tylenol) in 24 hours.  You should not take ibuprofen (Advil), aleve, motrin, naprosyn or other NSAIDS if you have a history of stomach ulcers or chronic kidney disease.  2. A prescription for pain medication may be given to you upon discharge.  Take your pain medication as prescribed, if you still have uncontrolled pain after taking acetaminophen (Tylenol) or ibuprofen (Advil). 3. Use ice packs to help control pain. 4. If you need a refill on your pain medication, please contact your pharmacy.  They will contact our office to request authorization. Prescriptions will not be filled after 5pm or on week-ends.  HOME MEDICATIONS 5. Take your usually prescribed medications unless otherwise directed.  DIET 6. You should follow a light diet the first few days after arrival home.  Be sure to include lots of fluids daily. Avoid fatty, fried foods.   CONSTIPATION 7. It is common to experience some constipation after surgery and if you are taking pain medication.  Increasing fluid intake and taking a stool softener (such as Colace) will usually help or prevent this problem from occurring.  A mild laxative (Milk of Magnesia or Miralax) should be taken according to package instructions if there are no bowel  movements after 48 hours.  WOUND/INCISION CARE 8. Most patients will experience some swelling and bruising in the area of the incisions.  Ice packs will help.  Swelling and bruising can take several days to resolve.  9. Unless discharge instructions indicate otherwise, follow guidelines below  a. STERI-STRIPS - you may remove your outer bandages 48 hours after surgery, and you may shower at that time.  You have steri-strips (small skin tapes) in place directly over the incision.  These strips should be left on the skin for 7-10 days.   b. DERMABOND/SKIN GLUE - you may shower in 24 hours.  The glue will flake off over the next 2-3 weeks. 10. Any sutures or staples will be removed at the office during your follow-up visit.  ACTIVITIES 11. You may resume regular (light) daily activities beginning the next day--such as daily self-care, walking, climbing stairs--gradually increasing activities as tolerated.  You may have sexual intercourse when it is comfortable.  Refrain from any heavy lifting or straining until approved by your doctor. a. You may drive when you are no longer taking prescription pain medication, you can comfortably wear a seatbelt, and you can safely maneuver your car and apply brakes.  FOLLOW-UP 12. You should see your doctor in the office for a follow-up appointment approximately 2-3 weeks after your surgery.  You should have been given your post-op/follow-up appointment when your surgery was scheduled.  If you did not receive a post-op/follow-up appointment, make sure that you call for this appointment within a day or two after you arrive home to insure a convenient appointment time.     WHEN TO CALL YOUR DOCTOR: 1. Fever over 101.0 2. Inability to urinate 3. Continued bleeding from incision. 4. Increased pain, redness, or drainage from the incision. 5. Increasing abdominal pain  The clinic staff is available to answer your questions during regular business hours.  Please don't  hesitate to call and ask to speak to one of the nurses for clinical concerns.  If you have a medical emergency, go to the nearest emergency room or call 911.  A surgeon from [facility] is always on call at the hospital. [address], [address], [place], [place]  [address] ? [address] [address], [place], [place]   [address] [phone] ? [phone] ? FAX [phone] Web site: [email or web address]  .........   Managing Your Pain After Surgery Without Opioids    Thank you for participating in our program to help patients manage their pain after surgery without opioids. This is part of our effort to provide you with the best care possible, without exposing you or your family to the risk that opioids pose.  What pain can I expect after surgery? You can expect to have some pain after surgery. This is normal. The pain is typically worse the day after surgery, and quickly begins to get better. Many studies have found that many patients are able to manage their pain after surgery with Over-the-Counter (OTC) medications such as Tylenol and Motrin. If you have a condition that does not allow you to take Tylenol or Motrin, notify your surgical team.  How will I manage my pain? The best strategy for controlling your pain after surgery is around the clock pain control with Tylenol (acetaminophen) and Motrin (ibuprofen or Advil). Alternating these medications with each other allows you to maximize your pain control. In addition to Tylenol and Motrin, you can use heating pads or ice packs on your incisions to help reduce your pain.  How will I alternate your regular strength over-the-counter pain medication? You will take a dose of pain medication every three hours. ; Start by taking 650 mg of Tylenol (2 pills of 325 mg) ; 3 hours later take 600 mg of Motrin (3 pills of 200 mg) ; 3 hours after taking the Motrin take 650 mg of Tylenol ; 3 hours after that take 600 mg of  Motrin.   - 1 -  See example - if your first dose of Tylenol is at 12:00 PM   12:00 PM Tylenol 650 mg (2 pills of 325 mg)  3:00 PM Motrin 600 mg (3 pills of 200 mg)  6:00 PM Tylenol 650 mg (2 pills of 325 mg)  9:00 PM Motrin 600 mg (3 pills of 200 mg)  Continue alternating every 3 hours   We recommend that you follow this schedule around-the-clock for at least 3 days after surgery, or until you feel that it is no longer needed. Use the table on the last page of this handout to keep track of the medications you are taking. Important: Do not take more than 3000mg of Tylenol or 3200mg of Motrin in a 24-hour period. Do not take ibuprofen/Motrin if you have a history of bleeding stomach ulcers, severe kidney disease, &/or actively taking a blood thinner  What if I still have pain? If you have pain that is not controlled with the over-the-counter pain medications (Tylenol and Motrin or Advil) you might have what we call "breakthrough" pain. You will receive a prescription for a small amount of an opioid pain medication such as   Oxycodone, Tramadol, or Tylenol with Codeine. Use these opioid pills in the first 24 hours after surgery if you have breakthrough pain. Do not take more than 1 pill every 4-6 hours.  If you still have uncontrolled pain after using all opioid pills, don't hesitate to call our staff using the number provided. We will help make sure you are managing your pain in the best way possible, and if necessary, we can provide a prescription for additional pain medication.   Day 1    Time  Name of Medication Number of pills taken  Amount of Acetaminophen  Pain Level   Comments  AM PM       AM PM       AM PM       AM PM       AM PM       AM PM       AM PM       AM PM       Total Daily amount of Acetaminophen Do not take more than  3,000 mg per day      Day 2    Time  Name of Medication Number of pills taken  Amount of Acetaminophen  Pain Level   Comments  AM  PM       AM PM       AM PM       AM PM       AM PM       AM PM       AM PM       AM PM       Total Daily amount of Acetaminophen Do not take more than  3,000 mg per day      Day 3    Time  Name of Medication Number of pills taken  Amount of Acetaminophen  Pain Level   Comments  AM PM       AM PM       AM PM       AM PM          AM PM       AM PM       AM PM       AM PM       Total Daily amount of Acetaminophen Do not take more than  3,000 mg per day      Day 4    Time  Name of Medication Number of pills taken  Amount of Acetaminophen  Pain Level   Comments  AM PM       AM PM       AM PM       AM PM       AM PM       AM PM       AM PM       AM PM       Total Daily amount of Acetaminophen Do not take more than  3,000 mg per day      Day 5    Time  Name of Medication Number of pills taken  Amount of Acetaminophen  Pain Level   Comments  AM PM       AM PM       AM PM       AM PM       AM PM       AM PM       AM PM         AM PM       Total Daily amount of Acetaminophen Do not take more than  3,000 mg per day       Day 6    Time  Name of Medication Number of pills taken  Amount of Acetaminophen  Pain Level  Comments  AM PM       AM PM       AM PM       AM PM       AM PM       AM PM       AM PM       AM PM       Total Daily amount of Acetaminophen Do not take more than  3,000 mg per day      Day 7    Time  Name of Medication Number of pills taken  Amount of Acetaminophen  Pain Level   Comments  AM PM       AM PM       AM PM       AM PM       AM PM       AM PM       AM PM       AM PM       Total Daily amount of Acetaminophen Do not take more than  3,000 mg per day        For additional information about how and where to safely dispose of unused opioid medications - https://www.morepowerfulnc.org  Disclaimer: This document contains information and/or instructional materials adapted from [facility]  for the typical patient with your condition. It does not replace medical advice from your health care provider because your experience may differ from that of the typical patient. Talk to your health care provider if you have any questions about this document, your condition or your treatment plan. Adapted from [facility]  

## 2020-03-09 NOTE — Anesthesia Preprocedure Evaluation (Addendum)
Anesthesia Evaluation  Patient identified by MRN, date of birth, ID band Patient awake    Reviewed: Allergy & Precautions, NPO status , Patient's Chart, lab work & pertinent test results  Airway Mallampati: II  TM Distance: >3 FB Neck ROM: Full    Dental  (+) Teeth Intact, Dental Advisory Given, Missing   Pulmonary neg pulmonary ROS,    Pulmonary exam normal breath sounds clear to auscultation       Cardiovascular hypertension, Pt. on medications Normal cardiovascular exam Rhythm:Regular Rate:Normal     Neuro/Psych negative neurological ROS     GI/Hepatic GERD  ,Cholecystitis    Endo/Other  negative endocrine ROS  Renal/GU negative Renal ROS     Musculoskeletal negative musculoskeletal ROS (+)   Abdominal   Peds  Hematology negative hematology ROS (+)   Anesthesia Other Findings Day of surgery medications reviewed with the patient.  Reproductive/Obstetrics                            Anesthesia Physical Anesthesia Plan  ASA: II  Anesthesia Plan: General   Post-op Pain Management:    Induction: Intravenous  PONV Risk Score and Plan: 4 or greater and Midazolam, Dexamethasone, Ondansetron and Diphenhydramine  Airway Management Planned: Oral ETT  Additional Equipment:   Intra-op Plan:   Post-operative Plan: Extubation in OR  Informed Consent: I have reviewed the patients History and Physical, chart, labs and discussed the procedure including the risks, benefits and alternatives for the proposed anesthesia with the patient or authorized representative who has indicated his/her understanding and acceptance.     Dental advisory given  Plan Discussed with: CRNA  Anesthesia Plan Comments:         Anesthesia Quick Evaluation

## 2020-03-09 NOTE — Progress Notes (Signed)
   Subjective/Chief Complaint: Pain is slightly better, continued nausea/ emesis overnight   Objective: Vital signs in last 24 hours: Temp:  [97.8 F (36.6 C)-98.7 F (37.1 C)] 97.9 F (36.6 C) (04/08 0412) Pulse Rate:  [83-94] 91 (04/08 0412) Resp:  [16-18] 17 (04/08 0412) BP: (125-161)/(67-107) 145/85 (04/08 0412) SpO2:  [97 %-100 %] 100 % (04/08 0412) Weight:  [63 kg] 63 kg (04/07 1111)    Intake/Output from previous day: 04/07 0701 - 04/08 0700 In: 100 [IV Piggyback:100] Out: -  Intake/Output this shift: No intake/output data recorded.  General appearance: alert and cooperative Resp: unlabored Cardio: regular rate and rhythm GI: soft, nondistended, tender in RUQ Skin: Skin color, texture, turgor normal. No rashes or lesions Neurologic: Grossly normal  Lab Results:  Recent Labs    03/08/20 1125 03/09/20 0445  WBC 7.6 11.4*  HGB 13.2 12.7  HCT 40.1 38.3  PLT 325 296   BMET Recent Labs    03/08/20 1125 03/09/20 0445  NA 138 137  K 3.6 3.7  CL 102 103  CO2 26 22  GLUCOSE 157* 145*  BUN 9 5*  CREATININE 0.78 0.62  CALCIUM 9.0 8.6*   PT/INR No results for input(s): LABPROT, INR in the last 72 hours. ABG No results for input(s): PHART, HCO3 in the last 72 hours.  Invalid input(s): PCO2, PO2  Studies/Results: US Abdomen Limited RUQ  Result Date: 03/08/2020 CLINICAL DATA:  Right upper quadrant pain. EXAM: ULTRASOUND ABDOMEN LIMITED RIGHT UPPER QUADRANT COMPARISON:  Right upper quadrant ultrasound dated July 15, 2019. FINDINGS: Gallbladder: 9 mm calculus again identified in the gallbladder neck. Sludge is present. No wall thickening visualized. No sonographic Murphy sign noted by sonographer. Common bile duct: Diameter: 6 mm, normal. Liver: No focal lesion identified. Unchanged increased parenchymal echogenicity. Fatty sparing along the gallbladder fossa. Portal vein is patent on color Doppler imaging with normal direction of blood flow towards the  liver. Other: None. IMPRESSION: 1. Similar appearing cholelithiasis without sonographic evidence of acute cholecystitis. 2. Unchanged hepatic steatosis. Electronically Signed   By: Obie Dredge M.D.   On: 03/08/2020 16:09    Anti-infectives: Anti-infectives (From admission, onward)   Start     Dose/Rate Route Frequency Ordered Stop   03/08/20 1700  cefTRIAXone (ROCEPHIN) 2 g in sodium chloride 0.9 % 100 mL IVPB     2 g 200 mL/hr over 30 Minutes Intravenous Every 24 hours 03/08/20 1651        Assessment/Plan: Calculous cholecysitis. Lap chole today. We have discussed the surgery and risks as previously documented. OR today.    LOS: 0 days    Berna Bue 03/09/2020

## 2020-03-09 NOTE — Op Note (Signed)
Operative Note  Verbena Noguchi 44 y.o. female [id number]  03/09/2020  Surgeon: Berna Bue MD FACS  Assistant: Barnetta Chapel PA-C  Procedure performed: Laparoscopic Cholecystectomy  Procedure classification: urgent  Preop diagnosis: acute calculous cholecystitis Post-op diagnosis/intraop findings: same  Specimens: gallbladder  Retained items: none  EBL: minimal  Complications: none  Description of procedure: After obtaining informed consent the patient was brought to the operating room. Antibiotics were administered. SCD's were applied. General endotracheal anesthesia was initiated and a formal time-out was performed. The abdomen was prepped and draped in the usual sterile fashion and the abdomen was entered using an infraumbilical veress needle after instilling the site with local. Insufflation to was obtained, 19mm trocar and camera inserted, and gross inspection revealed no evidence of injury from our entry or other intraabdominal abnormalities. Two 53mm trocars were introduced in the right midclavicular and right anterior axillary lines under direct visualization and following infiltration with local. An 38mm trocar was placed in the epigastrium. The gallbladder was tensely distended, thickened and erythematous. This was decompressed with the nezhat so that it could be grasped. The gallbladder was retracted cephalad and the infundibulum was retracted laterally. Omental adhesions to the gallbladder were divided with blunt dissection and cautery where needed, taking care to protect the adjacent duodenum and colon. A combination of hook electrocautery and blunt dissection was utilized to clear the peritoneum from the neck and cystic duct, circumferentially isolating the cystic artery and cystic duct and lifting the gallbladder from the cystic plate. The critical view of safety was achieved with the cystic artery, cystic duct, and liver bed visualized between them with no other  structures. The artery was clipped with a single clip proximally and distally and divided as was the cystic duct with three clips on the proximal end. The gallbladder was dissected from the liver plate using electrocautery. Once freed the gallbladder was placed in an endocatch bag and removed intact through the epigastric trocar site. A small amount of bleeding on the liver bed was controlled with cautery. This was aspirated and the right upper quadrant was irrigated copiously until the effluent was clear. Hemostasis was once again confirmed, and reinspection of the abdomen revealed no injuries. The clips were well opposed without any bile leak from the duct or the liver bed. The 26mm trocar site in the epigastrium was closed with a 0 vicryl in the fascia under direct visualization using a PMI device. The abdomen was desufflated and all trocars removed. The skin incisions were closed with subcuticular monocryl and Dermabond. The patient was awakened, extubated and transported to the recovery room in stable condition.    All counts were correct at the completion of the case.

## 2020-03-10 LAB — SURGICAL PATHOLOGY

## 2020-03-10 MED ORDER — ONDANSETRON HCL 4 MG PO TABS: 4.0000 mg | ORAL_TABLET | Freq: Four times a day (QID) | ORAL | 0 refills | Status: DC | PRN

## 2020-03-10 MED ORDER — TRAMADOL HCL 50 MG PO TABS: 50.0000 mg | ORAL_TABLET | Freq: Four times a day (QID) | ORAL | 0 refills | Status: DC | PRN

## 2020-03-10 MED ORDER — ACETAMINOPHEN 500 MG PO TABS: 1000.0000 mg | ORAL_TABLET | Freq: Four times a day (QID) | ORAL | Status: DC | PRN

## 2020-03-10 NOTE — Discharge Summary (Signed)
    Patient ID: Pamela Stanley [id number] [birth year] 44 y.o.  Admit date: 03/08/2020 Discharge date: 03/10/2020  Admitting Diagnosis: cholecystitis  Discharge Diagnosis Patient Active Problem List   Diagnosis Date Noted  . Symptomatic cholelithiasis 03/08/2020  . Palpitations 01/17/2020  . History of COVID-19 01/17/2020  . Language barrier 01/03/2013  cholecystitis, s/p lap chole  Consultants none  Reason for Admission: Patient is a 44 year old female who presented to [facility] with recurrent RUQ pain. Patient reports pain started 1 week ago and is in the RUQ, radiating to the right back. She was seen in the ED 2 days ago for the same thing and found to have cholelithiasis without cholecystitis. Tried taking prescribed pain medications without relief. Associated nausea and bilious emesis. Denies fever, chills, chest pain, SOB, diarrhea, urinary symptoms. She was scheduled to follow up as an outpatient with surgeon today, but her appointment had to be rescheduled to next week. PMH otherwise significant for GERD, HTN. She does not take any daily medications currently. NKDA. No past abdominal surgery. She denies alcohol, tobacco or illicit drug use. She works as a Agricultural engineer. Patient speaks vietnamese, but her husband is at the bedside and translating for her.   Procedures Lap chole, Dr. Fredricka Bonine 4/8  Hospital Course:  The patient was admitted and underwent a laparoscopic cholecystectomy.  The patient tolerated the procedure well.  On POD 1, the patient was tolerating a regular diet, voiding well, mobilizing, and pain was controlled with oral pain medications.  The patient was stable for DC home at this time with appropriate follow up made.   Physical Exam: Abd: soft, appropriately tender, incisions c/d/i, +BS, ND  Allergies as of 03/10/2020   No Known Allergies     Medication List    STOP taking these medications   HYDROcodone-acetaminophen 5-325 MG tablet Commonly known as: Norco       TAKE these medications   acetaminophen 500 MG tablet Commonly known as: TYLENOL Take 2 tablets (1,000 mg total) by mouth every 6 (six) hours as needed for mild pain (or temp > 100).   hydrOXYzine 25 MG tablet Commonly known as: ATARAX/VISTARIL Take 1 tablet (25 mg total) by mouth at bedtime as needed. What changed: reasons to take this   lisinopril 10 MG tablet Commonly known as: ZESTRIL Take 1 tablet (10 mg total) by mouth daily.   naproxen 375 MG tablet Commonly known as: NAPROSYN Take 1 tablet (375 mg total) by mouth 2 (two) times daily.   ondansetron 4 MG tablet Commonly known as: ZOFRAN Take 1 tablet (4 mg total) by mouth every 6 (six) hours as needed for nausea or vomiting.   traMADol 50 MG tablet Commonly known as: ULTRAM Take 1 tablet (50 mg total) by mouth every 6 (six) hours as needed for moderate pain.        Follow-up Information    [facility] Follow up on 03/30/2020.   Specialty: General Surgery Why: 2pm, arrive 30 minutes before your appointment for paperwork and check in process.  please bring photo ID and insurance card Contact information: [address] [place] [address] [phone]           Signed: Barnetta Chapel, [facility] 03/10/2020, 8:18 AM Please see Amion for pager number during day hours 7:00am-4:30pm, 7-11:30am on Weekends

## 2020-03-10 NOTE — Progress Notes (Signed)
Pamela Stanley to be D/C'd per MD order. Discussed with the patient and all questions fully answered. ? VSS, Skin clean, dry and intact without evidence of skin break down, no evidence of skin tears noted. ? IV catheter discontinued intact. Site without signs and symptoms of complications. Dressing and pressure applied. ? An After Visit Summary was printed in english and vietnamese and given to the patient. Patient informed where to pickup prescriptions. ? D/C education completed with patient/family including follow up instructions, medication list, d/c activities limitations if indicated, with other d/c instructions as indicated by MD - patient able to verbalize understanding, all questions fully answered.  ? Patient instructed to return to ED, call 911, or call MD for any changes in condition.  ? Patient to be escorted via WC, and D/C home via private auto.

## 2020-05-29 IMAGING — US ULTRASOUND ABDOMEN LIMITED
1 series · 14 of 24 positions shown · non-contrast
Comparison: None.

CLINICAL DATA: Abdominal pain

EXAM:
ULTRASOUND ABDOMEN LIMITED RIGHT UPPER QUADRANT

[Series 1: ultrasound abdomen limited · 14 of 24 slices shown]
[im 1/24]
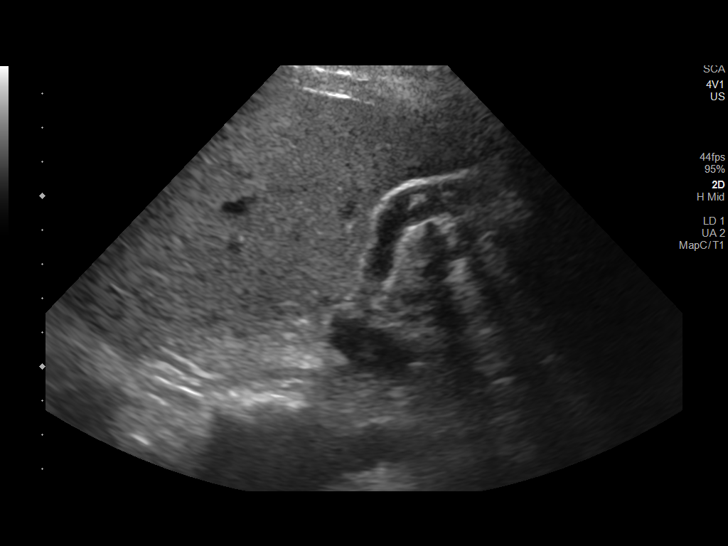
[im 3/24]
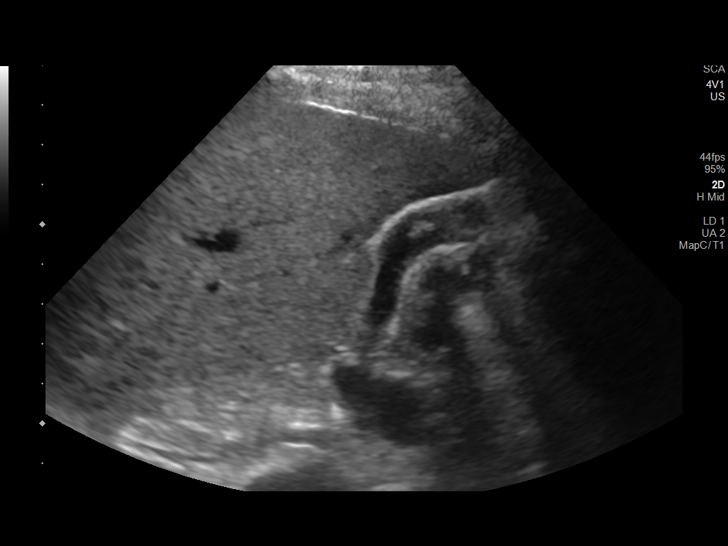
[im 5/24]
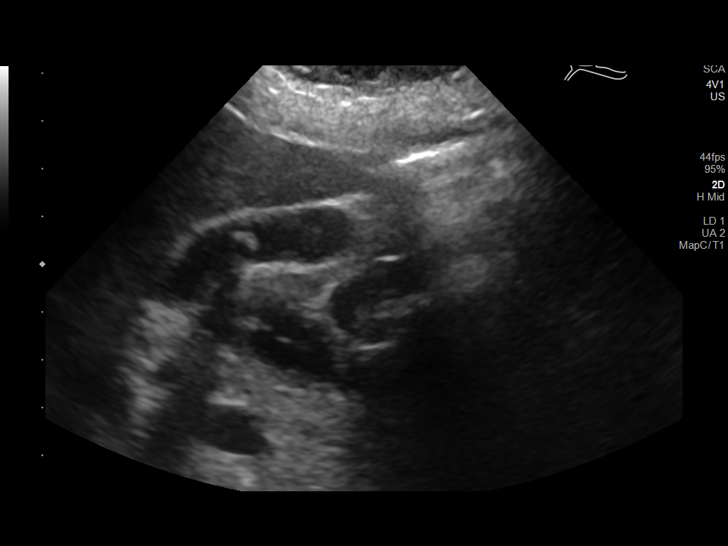
[im 7/24]
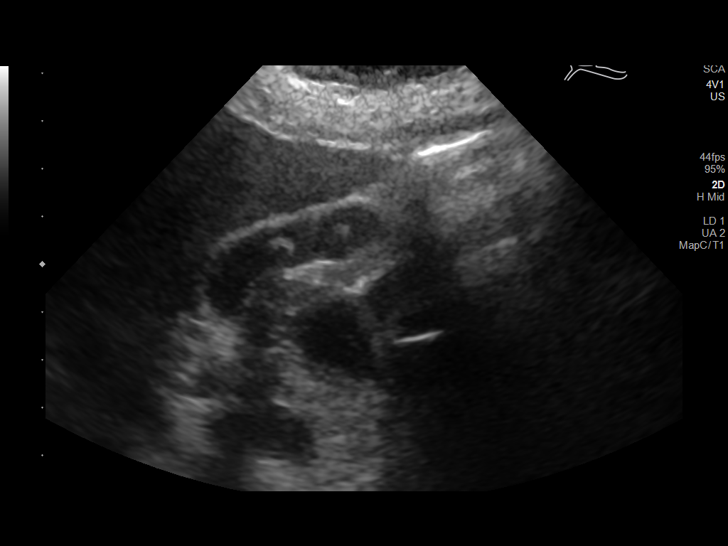
[im 8/24]
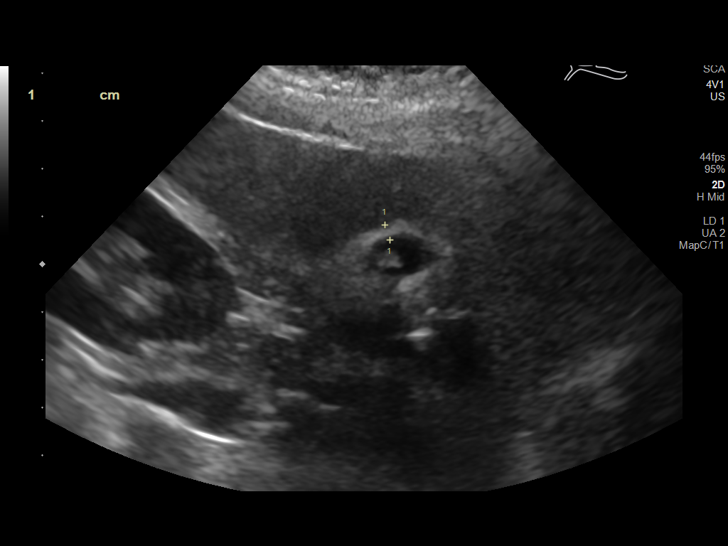
[im 10/24]
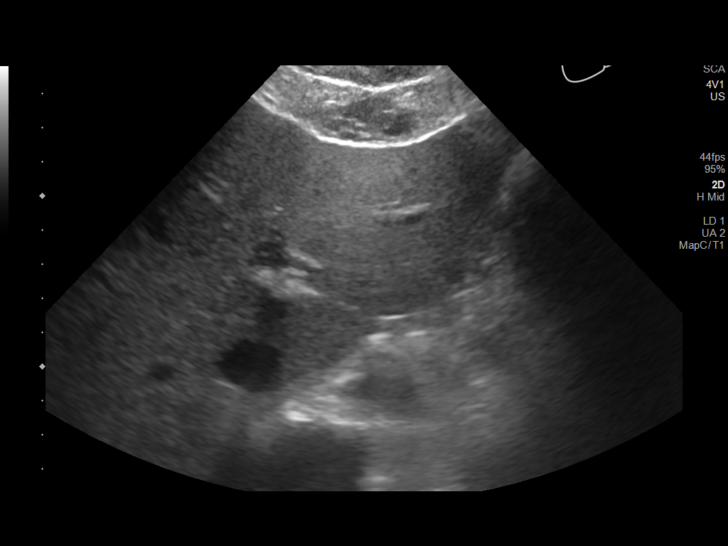
[im 12/24]
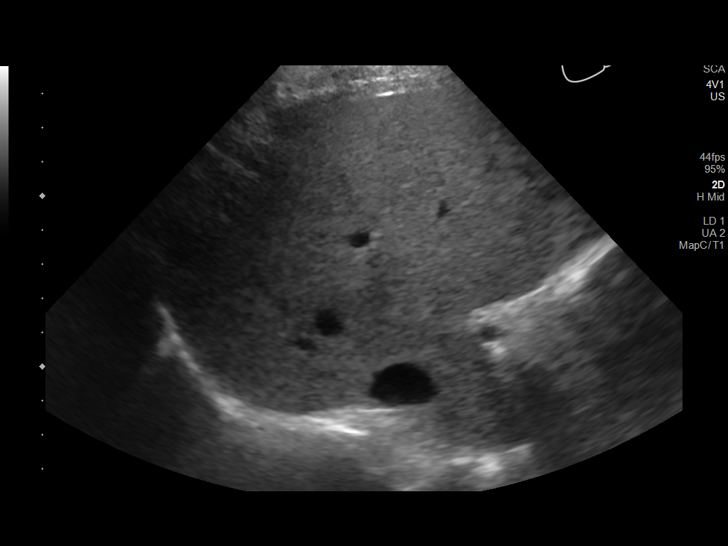
[im 13/24]
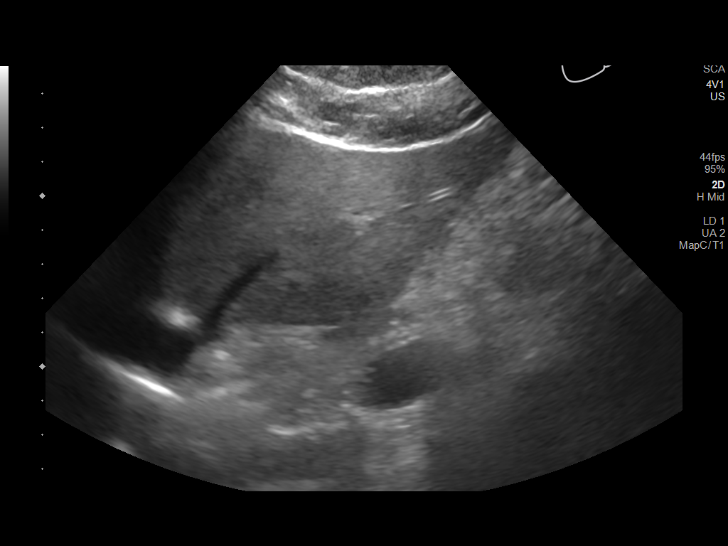
[im 15/24]
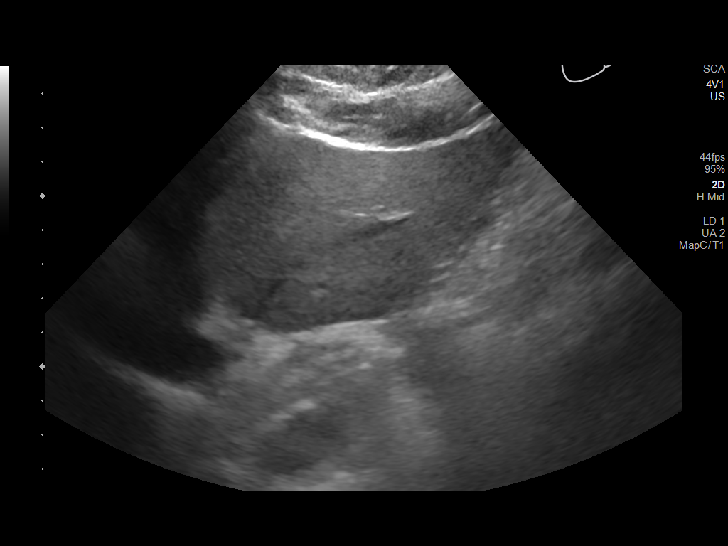
[im 17/24]
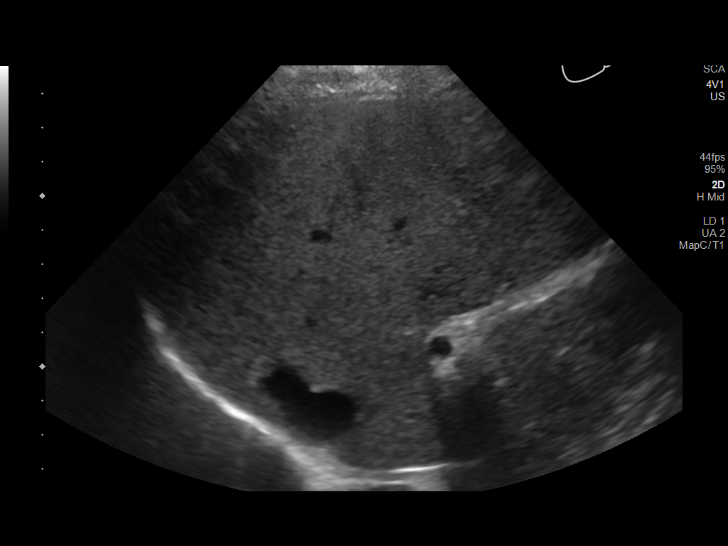
[im 19/24]
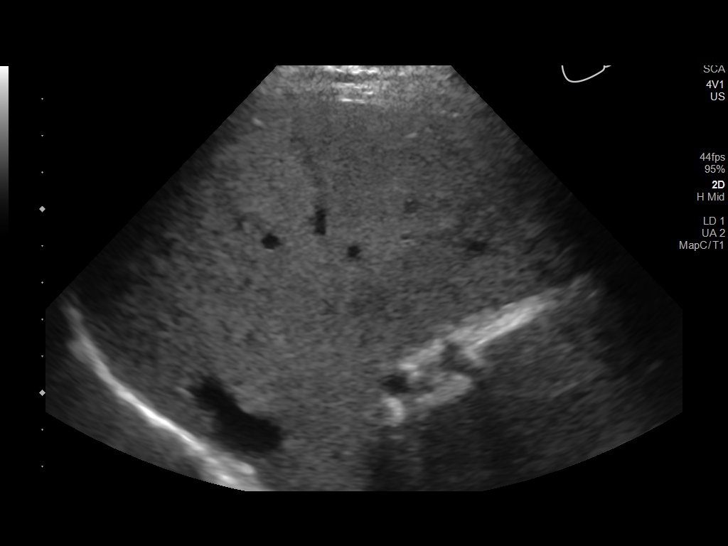
[im 20/24]
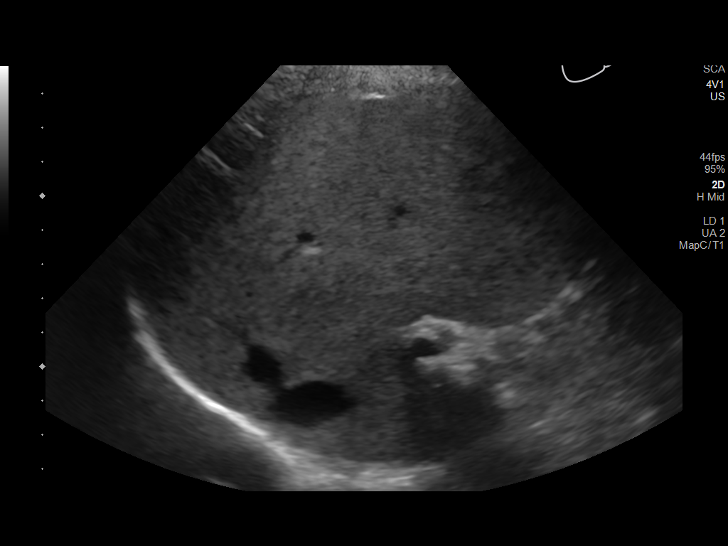
[im 22/24]
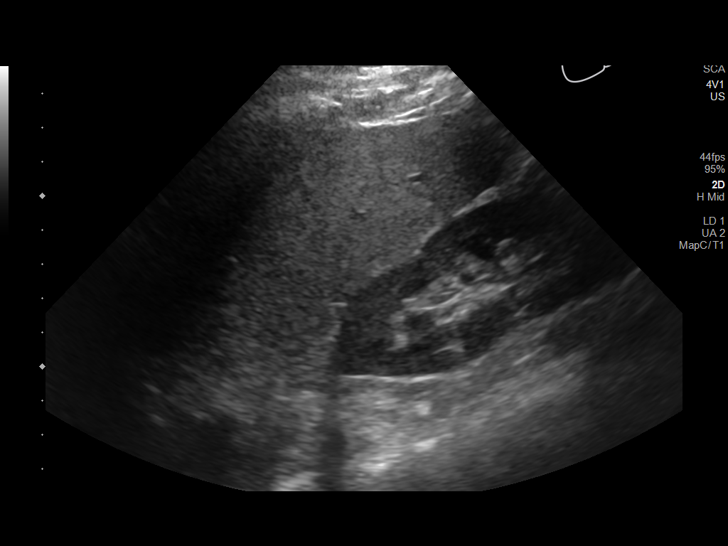
[im 24/24]
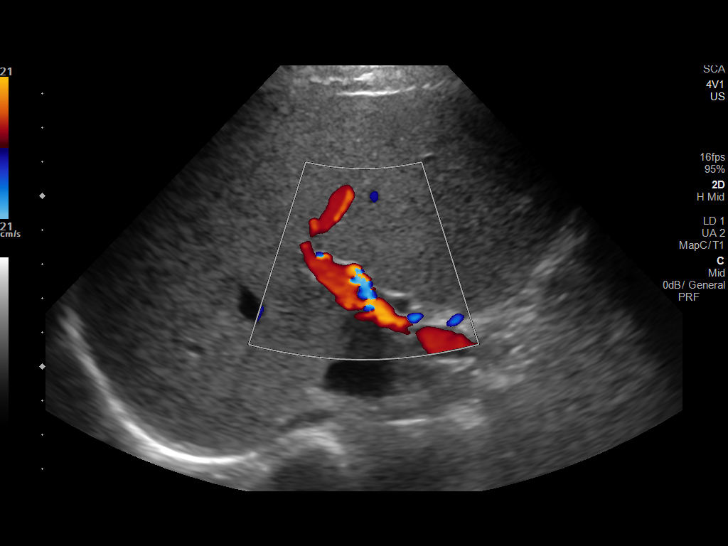

[14 of 24 positions shown; findings below may reference images not displayed]

FINDINGS: Gallbladder:

Multiple gallstones are noted measuring up to approximately 7 mm.
There is borderline gallbladder wall thickening, however the
gallbladder is not significantly distended. The sonographic Murphy
sign is reported as negative. There is no pericholecystic free
fluid.

Common bile duct:

Diameter: 0.2 cm

Liver:

No focal lesion identified. Within normal limits in parenchymal
echogenicity. Portal vein is patent on color Doppler imaging with
normal direction of blood flow towards the liver.
IMPRESSION: There is cholelithiasis without secondary signs of acute
cholecystitis.

## 2020-06-27 ENCOUNTER — Encounter (HOSPITAL_COMMUNITY): Payer: Self-pay | Admitting: Emergency Medicine

## 2020-06-27 ENCOUNTER — Emergency Department (HOSPITAL_COMMUNITY): Payer: BC Managed Care – PPO

## 2020-06-27 ENCOUNTER — Emergency Department (HOSPITAL_COMMUNITY)
Admission: EM | Admit: 2020-06-27 | Discharge: 2020-06-27 | Disposition: A | Discharge status: Home / Self Care | Payer: BC Managed Care – PPO | Attending: Emergency Medicine | Admitting: Emergency Medicine

## 2020-06-27 ENCOUNTER — Other Ambulatory Visit: Payer: Self-pay

## 2020-06-27 DIAGNOSIS — Y939 Activity, unspecified: Secondary | ICD-10-CM | POA: Insufficient documentation

## 2020-06-27 DIAGNOSIS — W208XXA Other cause of strike by thrown, projected or falling object, initial encounter: Secondary | ICD-10-CM | POA: Insufficient documentation

## 2020-06-27 DIAGNOSIS — Y999 Unspecified external cause status: Secondary | ICD-10-CM | POA: Insufficient documentation

## 2020-06-27 DIAGNOSIS — S0990XA Unspecified injury of head, initial encounter: Secondary | ICD-10-CM | POA: Diagnosis present

## 2020-06-27 DIAGNOSIS — Y929 Unspecified place or not applicable: Secondary | ICD-10-CM | POA: Diagnosis not present

## 2020-06-27 MED ORDER — NAPROXEN 500 MG PO TABS: 500.0000 mg | ORAL_TABLET | Freq: Two times a day (BID) | ORAL | 0 refills | Status: DC | PRN

## 2020-06-27 MED ORDER — ACETAMINOPHEN 325 MG PO TABS
650.0000 mg | ORAL_TABLET | Freq: Once | ORAL | Status: AC
  Administered 2020-06-27: 650 mg via ORAL
  Filled 2020-06-27: qty 2
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
## 2020-06-27 NOTE — ED Notes (Signed)
Attempted to call CT, no answer.

## 2020-06-27 NOTE — ED Provider Notes (Signed)
[facility] EMERGENCY DEPARTMENT Provider Note   CSN: [id number] Arrival date & time: 06/27/20  6967     History Chief Complaint  Patient presents with  . Headache    Pamela Stanley is a 44 y.o. female with a history of cholelithiasis & GERD who presents to the ED with complaints of headache S/p head injury 17 days prior. Patient states that a metal bar fell from a mild to moderate height and struck her on the L side on the top of her head. She did not have LOC at that time, however she has had persistent daily headaches since. Pain is constant, located to the L side of the head, worse with palpation, improved temporarily with tylenol.  She denies visual disturbance, numbness, weakness, dizziness, vomiting, seizure activity, syncope, or blood thinner use. Denies any other areas of injury.   Interpreter utilized throughout Audiological scientist.  HPI     Past Medical History:  Diagnosis Date  . Gallstones   . GERD (gastroesophageal reflux disease)   . Ulcer     Patient Active Problem List   Diagnosis Date Noted  . Symptomatic cholelithiasis 03/08/2020  . Palpitations 01/17/2020  . History of COVID-19 01/17/2020  . Language barrier 01/03/2013    Past Surgical History:  Procedure Laterality Date  . CHOLECYSTECTOMY N/A 03/09/2020   Procedure: LAPAROSCOPIC CHOLECYSTECTOMY;  Surgeon: Berna Bue, MD;  Location: [facility] OR;  Service: General;  Laterality: N/A;     OB History   No obstetric history on file.     Family History  Problem Relation Age of Onset  . Hyperlipidemia Mother   . Hypertension Mother   . Hypertension Father   . Hyperlipidemia Father     Social History   Tobacco Use  . Smoking status: Never Smoker  . Smokeless tobacco: Never Used  Vaping Use  . Vaping Use: Never used  Substance Use Topics  . Alcohol use: No    Alcohol/week: 0.0 standard drinks  . Drug use: No    Home Medications Prior to Admission medications   Medication Sig Start  Date End Date Taking? Authorizing Provider  acetaminophen (TYLENOL) 500 MG tablet Take 2 tablets (1,000 mg total) by mouth every 6 (six) hours as needed for mild pain (or temp > 100). 03/10/20   Barnetta Chapel, PA-C  hydrOXYzine (ATARAX/VISTARIL) 25 MG tablet Take 1 tablet (25 mg total) by mouth at bedtime as needed. Patient taking differently: Take 25 mg by mouth at bedtime as needed for anxiety (sleep).  12/09/19   Georgina Quint, MD  lisinopril (ZESTRIL) 10 MG tablet Take 1 tablet (10 mg total) by mouth daily. 12/09/19   Georgina Quint, MD  naproxen (NAPROSYN) 375 MG tablet Take 1 tablet (375 mg total) by mouth 2 (two) times daily. Patient not taking: Reported on 01/17/2020 08/13/19   Wurst, [place], PA-C  ondansetron (ZOFRAN) 4 MG tablet Take 1 tablet (4 mg total) by mouth every 6 (six) hours as needed for nausea or vomiting. 03/10/20   Barnetta Chapel, PA-C  traMADol (ULTRAM) 50 MG tablet Take 1 tablet (50 mg total) by mouth every 6 (six) hours as needed for moderate pain. 03/10/20   Barnetta Chapel, PA-C  dicyclomine (BENTYL) 20 MG tablet Take 1 tablet (20 mg total) by mouth every 6 (six) hours as needed for spasms. 05/12/19 08/13/19  Georgina Quint, MD  pantoprazole (PROTONIX) 40 MG tablet Take 1 tablet by mouth daily. 06/17/19 08/13/19  [provider]  Allergies    Patient has no known allergies.  Review of Systems   Review of Systems  Constitutional: Negative for chills and fever.  Eyes: Negative for visual disturbance.  Respiratory: Negative for cough and shortness of breath.   Cardiovascular: Negative for chest pain.  Gastrointestinal: Negative for abdominal pain and vomiting.  Musculoskeletal: Negative for back pain and neck pain.  Neurological: Positive for headaches. Negative for dizziness, seizures, syncope, facial asymmetry, speech difficulty, weakness and numbness.  All other systems reviewed and are negative.   Physical Exam Updated Vital Signs BP (!)  139/95 (BP Location: Right Arm)   Pulse 86   Temp 98.3 F (36.8 C) (Oral)   Resp 14   SpO2 100%   Physical Exam Vitals and nursing note reviewed.  Constitutional:      General: She is not in acute distress.    Appearance: She is well-developed. She is not toxic-appearing.  HENT:     Head: Normocephalic and atraumatic.     Comments: No obvious lacerations or hematomas.  Tender to the L parietal region.  Eyes:     General: Vision grossly intact. Gaze aligned appropriately.        Right eye: No discharge.        Left eye: No discharge.     Extraocular Movements: Extraocular movements intact.     Conjunctiva/sclera: Conjunctivae normal.     Comments: PERRL. No proptosis.   Cardiovascular:     Rate and Rhythm: Normal rate and regular rhythm.  Pulmonary:     Effort: Pulmonary effort is normal. No respiratory distress.     Breath sounds: Normal breath sounds. No wheezing, rhonchi or rales.  Abdominal:     General: There is no distension.     Palpations: Abdomen is soft.     Tenderness: There is no abdominal tenderness.  Musculoskeletal:     Cervical back: Normal range of motion and neck supple. No rigidity. No spinous process tenderness.  Skin:    General: Skin is warm and dry.     Findings: No rash.  Neurological:     Comments: Alert. Clear speech. No facial droop. CNIII-XII grossly intact. Bilateral upper and lower extremities' sensation grossly intact. 5/5 symmetric strength with grip strength and with plantar and dorsi flexion bilaterally . Normal finger to nose bilaterally. Negative pronator drift. Negative Romberg sign. Gait is steady and intact.   Psychiatric:        Behavior: Behavior normal.    ED Results / Procedures / Treatments   Labs (all labs ordered are listed, but only abnormal results are displayed) Labs Reviewed - No data to display  EKG None  Radiology CT Head Wo Contrast  Result Date: 06/27/2020 CLINICAL DATA:  Head trauma, moderate/severe. EXAM: CT  HEAD WITHOUT CONTRAST TECHNIQUE: Contiguous axial images were obtained from the base of the skull through the vertex without intravenous contrast. COMPARISON:  No pertinent prior exams are available for comparison. FINDINGS: Brain: Cerebral volume is normal. There is no acute intracranial hemorrhage. No demarcated cortical infarct. No extra-axial fluid collection. No evidence of intracranial mass. No midline shift. Vascular: No hyperdense vessel. Skull: Normal. Negative for fracture or focal lesion. Sinuses/Orbits: Visualized orbits show no acute finding. Minimal ethmoid sinus mucosal thickening at the imaged levels. No significant mastoid effusion. IMPRESSION: Unremarkable non-contrast CT appearance of the brain. No evidence of acute intracranial abnormality. Minimal ethmoid sinus mucosal thickening. Electronically Signed   By: Jackey Loge DO   On: 06/27/2020 15:14  Procedures Procedures (including critical care time)  Medications Ordered in ED Medications - No data to display  ED Course  I have reviewed the triage vital signs and the nursing notes.  Pertinent labs & imaging results that were available during my care of the patient were reviewed by me and considered in my medical decision making (see chart for details).    MDM Rules/Calculators/A&P                         Patient presents to the ED with complaints of persistent headache S/p head injury a little over 2 weeks prior. No other areas of injury. No open wounds or hematomas. Afebrile, no nuchal rigidity- do not suspect meningitis. No focal neuro deficits to raise concern for ischemic CVA. No proptosis or visual disturbance to raise concern for acute angle-closure glaucoma, temporal arteritis, or dural venous sinus thrombosis. Will obtain head CT for further assessment to r/o bleed, however given prolonged period  Additional history obtained:  Additional history obtained from nursing note and chart review.  Imaging Studies ordered:   I ordered imaging studies which included CT head wo contrast, I independently visualized and interpreted imaging which showed No acute intracranial pathology.   Suspect postconcussive syndrome to a degree.  Will discharge home with naproxen to take as needed for pain and follow-up with the concussion clinic. I discussed results, treatment plan, need for follow-up, and return precautions with the patient. Provided opportunity for questions, patient confirmed understanding and is in agreement with plan.   Portions of this note were generated with Scientist, clinical (histocompatibility and immunogenetics). Dictation errors may occur despite best attempts at proofreading.  Final Clinical Impression(s) / ED Diagnoses Final diagnoses:  Injury of head, initial encounter    Rx / DC Orders ED Discharge Orders         Ordered    naproxen (NAPROSYN) 500 MG tablet  2 times daily PRN     Discontinue  Reprint     06/27/20 [address], [place] R, PA-C 06/27/20 1521    Virgina Norfolk, DO 06/27/20 1530

## 2020-06-27 NOTE — Discharge Instructions (Signed)
You were seen in the emergency department today following a head injury.  We suspect that you have a concussion, otherwise known and as a mild traumatic brain injury.  Your CT scan did not show any new abnormality such as a brain bleed.  We would like you to follow-up with the [place] [facility] concussion clinic, contact information below:  Address: [address] [place], [place] [address] Phone: [phone]  Per [facility] Website:   What to Expect: Evaluations at the Concussion Clinic All patients at the Concussion Clinic are given an extensive three-part evaluation that includes: a computerized test to measure memory, visual processing speed, and reaction time a test that measures the systems that integrate movement, balance, and vision an in-depth review of a detailed symptoms checklist for signs of concussion The evaluation process is critical for the treatment and recovery of a concussed patient as no two concussions are alike. Thankfully, the diagnostic tools that trained professionals use can help to better manage head injuries.  Part of the technology the doctors and staff at [facility] use in their assessments is a computerized examination called ImPACT. This tool uses six tasks to measure memory, visual processing speed, and reaction time. By analyzing the results of the examination and comparing them to average responses or a baseline score for a patient, our staff can make an informed judgment about the patient's cognitive functions. In addition to the ImPACT examination, patients are given a Vestibular Ocular Motor Screening test. This is a simple and painless test that focuses on the systems that integrate a patient's movement, balance, and vision.  These tests are used in conjunction with a thorough review of a detailed symptoms checklist to complete the patient's evaluation and develop a treatment plan.  You do not need a referral,  and you can book an appointment online. Our Concussion Hotline is staffed by trained professionals during our regular office hours: Monday - Thursday from 7:30 AM to 4:30 PM, and Fridays from 7:30 AM to 12:00 PM, and the number to call is [phone]. The Concussion Clinic team meets with patients at our [facility] office. Call today.   Further ED Instructions:   Please call and follow-up within the concussion clinic as well as your primary care provider within the next 3 to 5 days.  In the meantime we would like you to avoid strenuous/over exertional activities such as sports or running.  Please avoid excess screen time utilizing cell phones, computers, or the TV.  Please avoid activities that require significant amount of concentration.  Please try to rest as much as possible.  We are sending home with a prescription for naproxen to help with pain.  - Naproxen is a nonsteroidal anti-inflammatory medication that will help with pain and swelling. Be sure to take this medication as prescribed with food, 1 pill every 12 hours,  It should be taken with food, as it can cause stomach upset, and more seriously, stomach bleeding. Do not take other nonsteroidal anti-inflammatory medications with this such as Advil, Motrin, Aleve, Mobic, Goodie Powder, or Motrin.    You make take Tylenol per over the counter dosing with these medications.   We have prescribed you new medication(s) today. Discuss the medications prescribed today with your pharmacist as they can have adverse effects and interactions with your other medicines including over the counter and prescribed medications. Seek medical evaluation if you start to experience new or abnormal symptoms after taking one of these medicines,  seek care immediately if you start to experience difficulty breathing, feeling of your throat closing, facial swelling, or rash as these could be indications of a more serious allergic reaction   Return to the ER for new or  worsening symptoms including but not limited to sudden change in headache, sudden worsening, change in your vision, numbness, weakness, passing out, seizure activity, fever, neck stiffness, or any other concerns that you may have.   Google translate: English to [place]  B?n ? ???c ??a vo khoa c?p c?u hm nay sau m?t ch?n th??ng ? ??u. Chng ti nghi ng? r?ng b?n b? ch?n ??ng, hay cn g?i l ch?n th??ng s? no nh?Marland Kitchen Ch?p CT c?a b?n khng cho th?y b?t th??ng m?i nh? ch?y mu no. Knh mong qu khch hng theo di t?i phng khm ch?n ??ng c? th? Azell Der, thng tin lin h? bn d??i:  ??a ch?: [address] [place], [place] [address] ?i?n tho?i: [phone]  NIKE khm Ch?n th??ng Per [employer]:  ?i?u g s? x?y ra: ?nh gi t?i Phng khm Ch?n th??ng T?t c? b?nh nhn t?i Phng khm Ch?n th??ng ??u ???c ?nh gi ba ph?n bao g?m: m?t bi ki?m tra trn my tnh ?? ?o tr nh?, t?c ?? x? l hnh ?nh v th?i gian ph?n ?ng m?t bi ki?m tra ?o l??ng cc h? th?ng tch h?p chuy?n ??ng, cn b?ng v t?m nhn ?nh gi su v? danh sch ki?m tra cc tri?u ch?ng chi ti?t ?? tm cc d?u hi?u c?a ch?n ??ng Ladell Heads trnh ?nh gi l r?t quan tr?ng ??i v?i vi?c ?i?u tr? v ph?c h?i m?t b?nh nhn b? ch?n ??ng v khng c hai ch?n ??ng no gi?ng nhau. R?t may, cc cng c? ch?n ?on m cc chuyn gia ???c ?o t?o s? d?ng c th? gip ki?m sot ch?n th??ng ??u t?t h?n. M?t ph?n cng ngh? m cc bc s? v nhn vin t?i [facility] s? d?ng trong ?nh gi c?a h? l m?t cu?c ki?m tra trn my tnh ???c g?i l ImPACT. Cng c? ny s? d?ng su tc v? ?? ?o b? nh?, t?c ?? x? l hnh ?nh v th?i gian ph?n ?ng. B?ng cch phn tch k?t qu? khm v so snh chng v?i cc cu tr? l?i trung bnh ho?c ?i?m c? b?n c?a m?t b?nh nhn, nhn vin c?a chng ti c th? ??a ra ?nh gi sng su?t v? cc ch?c n?ng nh?n th?c c?a b?nh nhn. Ngoi vi?c khm ImPACT, b?nh nhn cn ???c lm xt nghi?m T?m sot V?n  ??ng C? m?t Ti?n ?nh. ?y l m?t xt nghi?m ??n gi?n v khng gy ?au, t?p trung vo cc h? th?ng tch h?p chuy?n ??ng, th?ng b?ng v th? l?c c?a b?nh nhn. Cc xt nghi?m ny ???c s? d?ng cng v?i vi?c xem xt k? l??ng danh sch ki?m tra cc tri?u ch?ng chi ti?t ?? hon thnh vi?c ?nh gi b?nh nhn v pht tri?n k? ho?ch ?i?u tr?Marland Kitchen  B?n khng c?n gi?i thi?u, v b?n c th? ??t l?ch h?n tr?c tuy?n. ???ng dy nng Concussion c?a chng ti ???c nhn vin b?i cc chuyn gia ???c ?o t?o trong gi? hnh chnh thng th??ng c?a chng ti: Th? Hai - Th? N?m, t? 7:30 sng ??n 4:30 chi?u v th? Su t? 7:30 sng ??n 12:00 tr?a v s? ?i?n tho?i c?n g?i l [phone] . Nhm Phng khm Ch?n th??ng g?p g? v?i b?nh nhn t?i v?n phng [address] c?a [place] ti. G?i ngy hm nay.  H??ng d?n ED b? sung:  Vui lng g?i v theo di t?i phng khm ch?n ??ng c?ng nh? nh cung c?p d?ch v? ch?m [place] chnh c?a b?n trong vng 3 ??n 5 ngy t?i. Trong th?i gian ch? ??i, chng ti mu?n b?n trnh cc ho?t ??ng g?ng s?c / qu s?c nh? th? thao ho?c ch?y. Vui lng trnh s? d?ng ?i?n tho?i di ??ng, my tnh ho?c TV qu th?i gian s? d?ng mn hnh. Vui lng trnh cc ho?t ??ng ?i h?i s? t?p trung cao ??. Hy c? g?ng ngh? ng?i nhi?u nh?t c th?Esmeralda Arthur ti ?ang g?i v? nh m?t ??n thu?c naproxen ?? gip gi?m ?au.  - Naproxen l m?t lo?i thu?c ch?ng vim khng steroid s? gip gi?m ?au v s?ng t?y. Hy ch?c ch?n u?ng thu?c ny theo quy ??nh v?i th?c ?n, 1 vin m?i 12 gi?, Nn u?ng thu?c khi ?n, v n c th? gy kh ch?u cho d? dy v nghim tr?ng h?n l ch?y mu d? dy. Khng dng thu?c ch?ng vim khng steroid khc v?i thu?c ny nh? Advil, Motrin, Aleve, Mobic, Goodie Powder ho?c Motrin.  B?n th?c hi?n u?ng Tylenol theo li?u l??ng khng k ??n v?i nh?ng lo?i thu?c ny.  Chng ti ? k ??n cho b?n (cc) lo?i thu?c m?i hm nay. Th?o lu?n v? cc lo?i thu?c ???c k ??n ngay hm nay v?i d??c s? c?a b?n v chng c th? c tc d?ng ph? v t??ng tc  v?i cc lo?i thu?c khc c?a b?n bao g?m c? thu?c khng k ??n v thu?c ???c k ??n. Tm ki?m s? ?44 gi y t? n?u b?n b?t ??u g?p cc tri?u ch?ng m?i ho?c b?t th??ng sau khi dng m?t trong nh?ng lo?i thu?c ny, tm ki?m s? ch?m [place] ngay l?p t?c n?u b?n b?t ??u c?m th?y kh th?, c?m gic ?ng h?ng, s?ng m?t ho?c pht ban v ?y c th? l d?u hi?u c?a b?nh nghim tr?ng h?n d? ?ng   Tr? l?i phng khm ?? bi?t cc tri?u ch?ng m?i ho?c tr?m tr?ng h?n bao g?m nh?ng khng gi?i h?n ? s? thay ??i ??t ng?t v? ?au ??u, ??t ng?t tr? nn t?i t? h?n, thay ??i th? l?c, t, y?u, ng?t x?u, ho?t ??ng co gi?t, s?t, c?ng c? ho?c b?t k? m?i lo ng?i no khc m b?n c .

## 2020-06-27 NOTE — ED Notes (Signed)
Taken to CT.

## 2020-10-03 ENCOUNTER — Encounter (HOSPITAL_COMMUNITY): Payer: Self-pay

## 2020-10-03 ENCOUNTER — Other Ambulatory Visit: Payer: Self-pay

## 2020-10-03 ENCOUNTER — Ambulatory Visit (HOSPITAL_COMMUNITY)
Admission: RE | Admit: 2020-10-03 | Discharge: 2020-10-03 | Disposition: A | Discharge status: Home / Self Care | Payer: BC Managed Care – PPO | Source: Ambulatory Visit | Attending: Urgent Care | Admitting: Urgent Care

## 2020-10-03 VITALS — BP 138/84 | HR 91 | Temp 98.2°F | Resp 18

## 2020-10-03 DIAGNOSIS — L089 Local infection of the skin and subcutaneous tissue, unspecified: Secondary | ICD-10-CM | POA: Diagnosis not present

## 2020-10-03 DIAGNOSIS — H9202 Otalgia, left ear: Secondary | ICD-10-CM

## 2020-10-03 MED ORDER — CEPHALEXIN 500 MG PO CAPS: 500.0000 mg | ORAL_CAPSULE | Freq: Three times a day (TID) | ORAL | 0 refills | Status: DC

## 2020-10-03 MED ORDER — NAPROXEN 500 MG PO TABS: 500.0000 mg | ORAL_TABLET | Freq: Two times a day (BID) | ORAL | 0 refills | Status: DC

## 2020-10-03 NOTE — ED Provider Notes (Signed)
[facility]   MRN: [id number] DOB: 03-31-1976  Subjective:   Pamela Stanley is a 44 y.o. female presenting for 5 day hx of acute onset of left ear pain, bump. Denies fever, tinnitus, ear drainage, runny or stuffy nose, neck pain, lymph node swelling.  No current facility-administered medications for this encounter.  Current Outpatient Medications:  .  acetaminophen (TYLENOL) 500 MG tablet, Take 2 tablets (1,000 mg total) by mouth every 6 (six) hours as needed for mild pain (or temp > 100)., Disp: , Rfl:  .  hydrOXYzine (ATARAX/VISTARIL) 25 MG tablet, Take 1 tablet (25 mg total) by mouth at bedtime as needed. (Patient taking differently: Take 25 mg by mouth at bedtime as needed for anxiety (sleep). ), Disp: 30 tablet, Rfl: 1 .  naproxen (NAPROSYN) 500 MG tablet, Take 1 tablet (500 mg total) by mouth 2 (two) times daily as needed for moderate pain., Disp: 15 tablet, Rfl: 0   No Known Allergies  Past Medical History:  Diagnosis Date  . Gallstones   . GERD (gastroesophageal reflux disease)   . Ulcer      Past Surgical History:  Procedure Laterality Date  . CHOLECYSTECTOMY N/A 03/09/2020   Procedure: LAPAROSCOPIC CHOLECYSTECTOMY;  Surgeon: Berna Bue, MD;  Location: [facility] OR;  Service: General;  Laterality: N/A;    Family History  Problem Relation Age of Onset  . Hyperlipidemia Mother   . Hypertension Mother   . Hypertension Father   . Hyperlipidemia Father     Social History   Tobacco Use  . Smoking status: Never Smoker  . Smokeless tobacco: Never Used  Vaping Use  . Vaping Use: Never used  Substance Use Topics  . Alcohol use: No    Alcohol/week: 0.0 standard drinks  . Drug use: No    ROS   Objective:   Vitals: BP 138/84 (BP Location: Right Arm)   Pulse 91   Temp 98.2 F (36.8 C) (Oral)   Resp 18   LMP 09/21/2020 (Approximate)   SpO2 97%   Physical Exam Constitutional:      General: She is not in acute distress.    Appearance:  Normal appearance. She is well-developed. She is not ill-appearing.  HENT:     Head: Normocephalic and atraumatic.     Right Ear: Tympanic membrane, ear canal and external ear normal. There is no impacted cerumen.     Left Ear: Tympanic membrane normal. There is no impacted cerumen.     Ears:     Comments: Left distal ear canal has <0.5cm erythematous lesion that is painful. No spontaneous drainage.    Nose: Nose normal.     Mouth/Throat:     Mouth: Mucous membranes are moist.     Pharynx: Oropharynx is clear.  Eyes:     General: No scleral icterus.    Extraocular Movements: Extraocular movements intact.     Pupils: Pupils are equal, round, and reactive to light.  Cardiovascular:     Rate and Rhythm: Normal rate.  Pulmonary:     Effort: Pulmonary effort is normal.  Skin:    General: Skin is warm and dry.  Neurological:     General: No focal deficit present.     Mental Status: She is alert and oriented to person, place, and time.  Psychiatric:        Mood and Affect: Mood normal.        Behavior: Behavior normal.     Assessment and Plan :  PDMP not reviewed this encounter.  1. Superficial skin infection   2. Left ear pain     Given acute onset, tenderness, physical exam findings will cover for bacterial skin infection with Keflex, warm compresses. Use naproxen for pain and inflammation. Counseled patient on potential for adverse effects with medications prescribed/recommended today, ER and return-to-clinic precautions discussed, patient verbalized understanding.    Wallis Bamberg, [place] 10/03/20 1857

## 2020-10-03 NOTE — ED Triage Notes (Signed)
Pt c/o left ear pain for approx 4 days, states pain improved today. Pt states it "feels like a pimple". Raised lesion noted inside left ear canal approx 68mm in diameter.   Denies difficulty hearing, congestion, runny nose, cough, fever, drainage from ear.  Took advil several days ago.

## 2020-10-03 NOTE — Discharge Instructions (Addendum)
Use warm compresses 3-5 times daily, ~10 minutes each time.  If your wound worsen such as more pain, redness, more swelling, fevers then please return to our clinic as we may need to open your wound more or simply do a general recheck.  Take cephalexin for the infection of your skin. Use naproxen for pain and inflammation.

## 2020-12-11 ENCOUNTER — Telehealth (INDEPENDENT_AMBULATORY_CARE_PROVIDER_SITE_OTHER): Payer: BC Managed Care – PPO | Admitting: Family Medicine

## 2020-12-11 ENCOUNTER — Other Ambulatory Visit: Payer: Self-pay

## 2020-12-11 ENCOUNTER — Encounter: Payer: BC Managed Care – PPO | Admitting: Family Medicine

## 2020-12-11 ENCOUNTER — Encounter: Payer: Self-pay | Admitting: Family Medicine

## 2020-12-11 DIAGNOSIS — R059 Cough, unspecified: Secondary | ICD-10-CM

## 2020-12-11 MED ORDER — BENZONATATE 100 MG PO CAPS: 100.0000 mg | ORAL_CAPSULE | Freq: Three times a day (TID) | ORAL | 0 refills | Status: DC | PRN

## 2020-12-11 MED ORDER — MUCINEX DM MAXIMUM STRENGTH 60-1200 MG PO TB12: 1.0000 | ORAL_TABLET | Freq: Two times a day (BID) | ORAL | 0 refills | Status: DC

## 2020-12-11 NOTE — Addendum Note (Signed)
Addended by: Argentina Ponder on: 12/11/2020 04:39 PM   Modules accepted: Orders

## 2020-12-11 NOTE — Progress Notes (Signed)
Virtual Visit Note  I connected with patient on 12/11/20 at 1336 by telephone due to unable to work Epic video visit and verified that I am speaking with the correct person using two identifiers. Pamela Stanley is currently located at home and husband currently with her during this visit. The provider, Azalee Course Quashawn Jewkes, FNP is located in their office at time of visit.  I discussed the limitations, risks, security and privacy concerns of performing an evaluation and management service by telephone and the availability of in person appointments. I also discussed with the patient that there may be a patient responsible charge related to this service. The patient expressed understanding and agreed to proceed.   I provided 20 minutes of non-face-to-face time during this encounter.  Chief Complaint  Patient presents with  . Cough    Sore throat, chills, body aches x 3 days taking dayquil/ nightquil     HPI ? Symptoms Started 3 days ago Taking dayquil/nyquil Denies sick contacts   No Known Allergies  Prior to Admission medications   Medication Sig Start Date End Date Taking? Authorizing Provider  acetaminophen (TYLENOL) 500 MG tablet Take 2 tablets (1,000 mg total) by mouth every 6 (six) hours as needed for mild pain (or temp > 100). 03/10/20  Yes Barnetta Chapel, PA-C  hydrOXYzine (ATARAX/VISTARIL) 25 MG tablet Take 1 tablet (25 mg total) by mouth at bedtime as needed. Patient not taking: Reported on 12/11/2020 12/09/19   Georgina Quint, MD  naproxen (NAPROSYN) 500 MG tablet Take 1 tablet (500 mg total) by mouth 2 (two) times daily with a meal. Patient not taking: Reported on 12/11/2020 10/03/20   Wallis Bamberg, PA-C  dicyclomine (BENTYL) 20 MG tablet Take 1 tablet (20 mg total) by mouth every 6 (six) hours as needed for spasms. 05/12/19 08/13/19  Georgina Quint, MD  lisinopril (ZESTRIL) 10 MG tablet Take 1 tablet (10 mg total) by mouth daily. Patient not taking: Reported on 06/27/2020  12/09/19 06/27/20  Georgina Quint, MD  pantoprazole (PROTONIX) 40 MG tablet Take 1 tablet by mouth daily. 06/17/19 08/13/19  [provider]    Past Medical History:  Diagnosis Date  . Gallstones   . GERD (gastroesophageal reflux disease)   . Ulcer     Past Surgical History:  Procedure Laterality Date  . CHOLECYSTECTOMY N/A 03/09/2020   Procedure: LAPAROSCOPIC CHOLECYSTECTOMY;  Surgeon: Berna Bue, MD;  Location: [facility] OR;  Service: General;  Laterality: N/A;    Social History   Tobacco Use  . Smoking status: Never Smoker  . Smokeless tobacco: Never Used  Substance Use Topics  . Alcohol use: No    Alcohol/week: 0.0 standard drinks    Family History  Problem Relation Age of Onset  . Hyperlipidemia Mother   . Hypertension Mother   . Hypertension Father   . Hyperlipidemia Father     Review of Systems  Constitutional: Positive for chills, fever and malaise/fatigue. Negative for weight loss.  HENT: Positive for sore throat. Negative for congestion and sinus pain.   Respiratory: Positive for cough and sputum production. Negative for shortness of breath and wheezing.   Cardiovascular: Negative for chest pain and palpitations.  Gastrointestinal: Negative for abdominal pain, constipation, diarrhea, nausea and vomiting.  Musculoskeletal: Positive for myalgias.  Neurological: Negative for headaches.    Objective  Constitutional:      General: Not in acute distress.    Appearance: Normal appearance. Not ill-appearing.   Pulmonary:     Effort:  Pulmonary effort is normal. No respiratory distress.  Neurological:     Mental Status: Alert and oriented to person, place, and time.  Psychiatric:        Mood and Affect: Mood normal.        Behavior: Behavior normal.     ASSESSMENT and PLAN  Problem List Items Addressed This Visit   None   Visit Diagnoses    Cough    -  Primary   Relevant Medications   benzonatate (TESSALON) 100 MG capsule    Dextromethorphan-guaiFENesin (MUCINEX DM MAXIMUM STRENGTH) 60-1200 MG TB12   Other Relevant Orders   COVID-19, Flu A+B and RSV     Plan Continue conservative treatment Sore throat drops or chloraseptic as needed Tylenol for fevers RTC precautions provided R/se/b of medications discussed   Return if symptoms worsen or fail to improve.   The above assessment and management plan was discussed with the patient. The patient verbalized understanding of and has agreed to the management plan. Patient is aware to call the clinic if symptoms persist or worsen. Patient is aware when to return to the clinic for a follow-up visit. Patient educated on when it is appropriate to go to the emergency department.     Pamela Carls Raigan Baria, FNP-BC Primary Care at [facility] [address] [place], [place] [address] Ph.  [phone] Fax [phone]

## 2020-12-13 LAB — COVID-19, FLU A+B AND RSV
Influenza A, NAA: NOT DETECTED
Influenza B, NAA: NOT DETECTED
RSV, NAA: NOT DETECTED
SARS-CoV-2, NAA: NOT DETECTED

## 2020-12-25 ENCOUNTER — Ambulatory Visit: Payer: BC Managed Care – PPO | Admitting: Family Medicine

## 2020-12-26 ENCOUNTER — Encounter: Payer: Self-pay | Admitting: Family Medicine

## 2020-12-26 ENCOUNTER — Other Ambulatory Visit: Payer: Self-pay

## 2020-12-26 ENCOUNTER — Ambulatory Visit: Payer: BC Managed Care – PPO | Admitting: Family Medicine

## 2020-12-26 VITALS — BP 133/89 | HR 94 | Temp 97.5°F | Ht 61.0 in | Wt 150.0 lb

## 2020-12-26 DIAGNOSIS — B029 Zoster without complications: Secondary | ICD-10-CM | POA: Diagnosis not present

## 2020-12-26 DIAGNOSIS — Z3169 Encounter for other general counseling and advice on procreation: Secondary | ICD-10-CM | POA: Diagnosis not present

## 2020-12-26 DIAGNOSIS — Z1231 Encounter for screening mammogram for malignant neoplasm of breast: Secondary | ICD-10-CM | POA: Diagnosis not present

## 2020-12-26 MED ORDER — VALACYCLOVIR HCL 1 G PO TABS: 1000.0000 mg | ORAL_TABLET | Freq: Three times a day (TID) | ORAL | 0 refills | Status: AC

## 2020-12-26 NOTE — Patient Instructions (Addendum)
B?nh Zona Shingles  B?nh zona, cn ???c g?i l gi??i leo (herpes zoster), l m?t b?nh nhi?m trng gy pht ban ?au ???n trn da v m?n n??c ch?a ??y d?ch. B?nh ny do vi rt gy ra. B?nh zona ch? pht tri?n ? nh?ng ng??i:  ?a? bi? b?nh thu?y ??u.  ? ???c cho dng thu?c ?? phng b?nh th?y ??u (? ???c tim v?c xin). B?nh zona hi?m g?p trong nhm ny. Nguyn nhn g gy ra? B?nh zona do vi ru?t varicella-zoster (VZV) gy ra. ?y l cu?ng la? lo?i vi rt gy b?nh th?y ??u. Sau khi ti?p xc v?i VZV, vi ru?t na?y ?? la?i trong c? th? trong tra?ng tha?i b?t ho?t (ng?). B?nh zona s? pht tri?n n?u vi ru?t hoa?t ??ng tr?? la?i. ?i?u ny c th? x?y ra nhi?u n?m sau khi ti?p xc v?i VZV ??u tin (l?n ??u). Ch?a ro? nguyn nhn la?m cho vi ru?t hoa?t ??ng tr?? la?i. ?i?u g lm t?ng nguy c?? Nh?ng ng??i ? b? b?nh th?y ??u ho?c ?a? ???c tim v?c xin ng?a th?y ??u c nguy c? b? b?nh zona. M?c b?nh zona ph? bi?n h?n ? nh?ng ng??i:  Trn 60 tu?i.  Co? h? th?ng pho?ng ch?ng b?nh t?t (h?mi?n d?ch) y?u, ch?ng h?n nh?ng ng??i c: ? HIV. ? AIDS. ? Ung th?.  ?ang dng cc lo?i thu?c lm suy y?u h? mi?n d?ch, ch?ng h?n nh? thu?c c?y ghp.  Tr?i qua r?t nhi?u c?ng th?ng. Cc d?u hi?u ho?c tri?u ch?ng l g? Cc tri?u ch?ng s?m c?a b?nh ny bao g?m ng?a, ?au bu?t v ?au ? m?t khu v?c trn da c?a quy? vi?. ?au c th? ???c m t? l rt b?ng, nh? bi? ?m ho?c ?au nhi. Vi ngy ho?c vi tu?n t? cc tri?u ch?ng ban ??u, pht ban ?? gy ?au xu?t hi?n. Pht ban th??ng xu?t hi?n ? m?t bn c? th? v c d?ng d?i ho?c d?ng ?ai. Cu?i cng, ban chuy?n sang m?n n??c ch?a di?ch v? ra, chuy?n thnh v?y v kh trong kho?ng 2-3 tu?n. Vo b?t k? th?i gian na?o trong qu trnh nhi?m tru?ng, quy? vi? co? th? bi?:  S?t.  ?n l?nh.  ?au ??u.  C?m gic kh ch?u ? b?ng. Ch?n ?on tnh tr?ng ny nh? th? no? B?nh ny c th? ???c ch?n ?on b?ng cch khm da. M?u da ho?c m?u di?ch co? th? ????c l?y t? cc m?n  n??c tr??c khi ch?n ?on. Nh?ng m?u na?y s? ???c ki?m tra d??i knh hi?n vi ho?c g?i ??n m?t phng xt nghi?m ?? xe?t nghi?m. Tnh tr?ng ny ???c ?i?u tr? nh? th? no? Pht ban c th? ko di vi tu?n. Khng c cch ch?a c? th? cho tnh tr?ng ny. Chuyn gia ch?m [place] s?c kh?e c?a quy? vi? c th? s? k ??n thu?c ?? gip quy? vi? gia?m ?au, ph?c h?i nhanh chng h?n v trnh cc v?n ?? lu di. Thu?c c th? bao g?m:  Thu?c kha?ng vi ru?t.  Thu?c ch?ng vim.  Thu?c gi?m ?au.  Thu?c ch?ng ng?a (thu?c khng histamine). N?u vng ? ? trn m?t, qu v? c th? ???c gi?i thi?u ??n m?t bc s? chuyn khoa, ch?ng h?n nh? bc s? chuyn khoa m?t (bc s? nhn khoa) ho?c bc s? tai, m?i, v h?ng (ENT) (bc s? tai m?i h?ng) nh?m gip qu v? trnh b? cc v?n ?? v? m?t, ?au m?n tnh, ho?c tn t?t. Tun th? nh?ng h??ng d?n ny ? nh: Thu?c  Ch?  s? d?ng thu?c khng k ??n v thu?c k ??n theo ch? d?n c?a chuyn gia ch?m [place] s?c kh?e.  Bi kem ch?ng ng?a ho?c kem thu?c gy t vo vng b? ?nh h??ng theo h??ng d?n c?a chuyn gia ch?m [place] s?c kh?e c?a quy? vi?. Gi?m ng?a v gi?m c?m gic kh ch?u  Ch??m kh?n ??t, l?nh (ch??m l?nh) vo vng pht ban ho?c m?n n??c theo ch? d?n c?a chuyn gia ch?m [place] s?c kh?e.  T?m n??c mt c th? ?? ng?a. Th? cho thm thu?c mu?i (baking soda) ho?c b?t y?n m?ch kh vo n??c ?? lm gi?m ng?a. Khng t?m n??c nng.   Ch?m so?c mu?n n??c va? pha?t ban  Che ch? pha?t ban la?i b??ng b?ng lo?ng (g?c). M??c qu?n a?o r?ng ?? giu?p gia?m ?au do va?i qu?n a?o co? va?o ch? pha?t ban.  Gi?? cho ch? pht ban v m?n n??c c?a quy? vi? s?ch s? b?ng cch r?a vng ny v?i x pho?ng nh? v n??c mt ho?c theo ch? d?n c?a chuyn gia ch?m [place] s?c kh?e c?a quy? vi?.  Ki?m tra ch? pha?t ban m?i ngy xem c d?u hi?u nhi?m trng hay khng. Ki?m tra xem c: ? ??, s?ng n?, ho?c ?au nhi?u h?n. ? Di?ch ho?c mu. ? ?m. ? M? ho?c mi hi.  Khng gi ch? pht ban ho?c ch?c vo m?n n??c. ?? gip qu  v? trnh gi: ? C?t ng?n v gi? mng tay s?ch s?. ? ?eo g?ng tay lc ng?, n?u gi l v?n ?? c?a qu v?. H??ng d?n chung  Ngh? ng?i theo ch? d?n c?a chuyn gia ch?m [place] s?c kh?e.  Tun th? t?t c? cc l?n khm theo di theo ch? d?n c?a chuyn gia ch?m [place] s?c kh?e. ?i?u ny c vai tr quan tr?ng.  R?a tay th???ng xuyn b?ng x phng v n??c. N?u khng c x phng v n??c, hy dng thu?c st trng tay. Lm nh? v?y gip gi?m nguy c? nhi?m trng da do vi khu?n.  Tr??c khi m?n n??c cu?a quy? vi? ?o?ng va?y, tnh tr?ng nhi?m b?nh zona c?a qu v? c th? gy b?nh th?y ??u ? nh?ng ng??i ch?a bao gi? bi? ho?c ch?a bao gi? ????c tim v??c xin pho?ng b?nh ny. ?? ng?n ng?a ?i?u ny x?y ra, hy trnh ti?p xc v?i nh?ng ng??i khc, ??c bi?t l: ? Tre? em. ? Ph? n? c thai. ? Tr? em b? b?nh chm (eczema). ? Ng??i cao tu?i c c? [place] c?y ghp. ? Nh?ng ng??i b? b?nh ma?n tnh, nh? ung th? ho?c AIDS. Hy lin l?c v?i chuyn gia ch?m  s?c kh?e n?u:  Qu v? khng gia?m ?au sau khi dng thu?c ? k ??n.  C?n ?au cu?a quy? vi? khng ??? h?n sau khi lnh pht ban.  Qu v? c cc d?u hi?u nhi?m trng ? vng pht ban, ch?ng h?n nh?: ? T?y ??, s?ng, ho?c ?au h?n quanh ch? pht ban. ? D?ch ho?c mu t? ch? pht ban ch?y ra. ? C?m gic ?m nng vng pht ban khi ch?m vo. ? M? ho?c mi hi thot ra ? ch? pht ban. Yu c?u tr? gip ngay l?p t?c n?u:  Pht ban ? trn m?t ho?c trn m?i cu?a quy? vi?.  Quy? vi? bi? ?au ?? m?t, ?au xung quanh vng m?t ho?c m?t c?m gic ? m?t bn m?t.  Qu v? [place]? vi? bi? ?au tai ho?c bi? u? tai.  Quy? vi? m?t vi? gia?c.  Tnh tr?ng c?a qu v? tr?m tr?ng h?n. Tm t?t  B?nh zona, cn ???c g?i l gi??i leo (herpes zoster), l m?t b?nh nhi?m trng gy pht ban ?au ???n trn da v m?n n??c ch?a ??y d?ch.  B?nh ny c th? ???c ch?n ?on b?ng cch khm da. M?u da ho?c m?u di?ch co? th? ????c l?y t? cc m?n n??c v ???c xt nghi?m tr??c khi ch?n ?on.  Che ch?  pha?t ban la?i b??ng b?ng lo?ng (g?c). M??c qu?n a?o r?ng ?? giu?p gia?m ?au do va?i qu?n a?o co? va?o ch? pha?t ban.  Tr??c khi m?n n??c cu?a quy? vi? ?o?ng va?y, tnh tr?ng nhi?m b?nh zona c?a qu v? c th? gy b?nh th?y ??u ? nh?ng ng??i ch?a bao gi? bi? ho?c ch?a bao gi? ????c tim v??c xin pho?ng b?nh ny. Thng tin ny khng nh?m m?c ?ch thay th? cho l?i khuyn m chuyn gia ch?m [place] s?c kh?e ni v?i qu v?. Hy b?o ??m qu v? ph?i th?o lu?n b?t k? v?n ?? g m qu v? c v?i chuyn gia ch?m [place] s?c kh?e c?a qu v?. Document Revised: 10/03/2017 Document Reviewed: 10/03/2017 Elsevier Patient Education  2021 [employer].    If you have lab work done today you will be contacted with your lab results within the next 2 weeks.  If you have not heard from [place] then please contact us. The fastest way to get your results is to register for My Chart.   IF you received an x-ray today, you will receive an invoice from [facility]. Please contact [facility] at [phone] with questions or concerns regarding your invoice.   IF you received labwork today, you will receive an invoice from [facility]. Please contact [facility] at [phone] with questions or concerns regarding your invoice.   Our billing staff will not be able to assist you with questions regarding bills from these companies.  You will be contacted with the lab results as soon as they are available. The fastest way to get your results is to activate your My Chart account. Instructions are located on the last page of this paperwork. If you have not heard from [place] regarding the results in 2 weeks, please contact this office.

## 2020-12-26 NOTE — Progress Notes (Signed)
1/25/202210:32 AM  Pamela Stanley [birth year], 45 y.o., female [id number]  Chief Complaint  Patient presents with  . rash on chest    Started about 4 days ago   . Hypertension    Patient states takes lisinopril as needed and would like an adjustment to dosage  . Gastroesophageal Reflux    Medication refill     HPI:   Patient is a 45 y.o. female with past medical history significant for gall bladder removal who presents today for a rash.  Pain on her right chest and then rash started Started 4 days ago Never had this in the past  Doesn't itch but is painful  Denies new skin products or clothing Had chicken pox as a child  Has never had a mammogram  Would like to get pregnant Had a previous gall bladder surgery Was told in the past she has Narrow fallopian tubes Has been trying for years Been married for 12 years never used contraceptive and no pregnancies Not having regular periods, always have been irregular LMP 1/22       Depression screen [facility] 2/9 12/26/2020 01/17/2020 12/20/2019  Decreased Interest 0 0 0  Down, Depressed, Hopeless 0 0 0  PHQ - 2 Score 0 0 0    Fall Risk  12/26/2020 01/17/2020 12/20/2019 12/09/2019 12/01/2019  Falls in the past year? 0 1 1 0 1  Number falls in past yr: 0 - 0 - 0  Injury with Fall? 0 0 0 - -  Follow up Falls evaluation completed Falls evaluation completed Falls evaluation completed Falls evaluation completed Falls evaluation completed     No Known Allergies  Prior to Admission medications   Medication Sig Start Date End Date Taking? Authorizing Provider  acetaminophen (TYLENOL) 500 MG tablet Take 2 tablets (1,000 mg total) by mouth every 6 (six) hours as needed for mild pain (or temp > 100). 03/10/20   Barnetta Chapel, PA-C  benzonatate (TESSALON) 100 MG capsule Take 1-2 capsules (100-200 mg total) by mouth 3 (three) times daily as needed for cough. 12/11/20   Zakari Couchman, Azalee Course, FNP  Dextromethorphan-guaiFENesin (MUCINEX DM MAXIMUM  STRENGTH) 60-1200 MG TB12 Take 1 tablet by mouth every 12 (twelve) hours. 12/11/20   Kaetlin Bullen, Azalee Course, FNP  hydrOXYzine (ATARAX/VISTARIL) 25 MG tablet Take 1 tablet (25 mg total) by mouth at bedtime as needed. Patient not taking: Reported on 12/11/2020 12/09/19   Georgina Quint, MD  naproxen (NAPROSYN) 500 MG tablet Take 1 tablet (500 mg total) by mouth 2 (two) times daily with a meal. Patient not taking: Reported on 12/11/2020 10/03/20   Wallis Bamberg, PA-C  dicyclomine (BENTYL) 20 MG tablet Take 1 tablet (20 mg total) by mouth every 6 (six) hours as needed for spasms. 05/12/19 08/13/19  Georgina Quint, MD  lisinopril (ZESTRIL) 10 MG tablet Take 1 tablet (10 mg total) by mouth daily. Patient not taking: Reported on 06/27/2020 12/09/19 06/27/20  Georgina Quint, MD  pantoprazole (PROTONIX) 40 MG tablet Take 1 tablet by mouth daily. 06/17/19 08/13/19  [provider]    Past Medical History:  Diagnosis Date  . Gallstones   . GERD (gastroesophageal reflux disease)   . Ulcer     Past Surgical History:  Procedure Laterality Date  . CHOLECYSTECTOMY N/A 03/09/2020   Procedure: LAPAROSCOPIC CHOLECYSTECTOMY;  Surgeon: Berna Bue, MD;  Location: [facility] OR;  Service: General;  Laterality: N/A;    Social History   Tobacco Use  . Smoking status: Never Smoker  .  Smokeless tobacco: Never Used  Substance Use Topics  . Alcohol use: No    Alcohol/week: 0.0 standard drinks    Family History  Problem Relation Age of Onset  . Hyperlipidemia Mother   . Hypertension Mother   . Hypertension Father   . Hyperlipidemia Father     Review of Systems  Constitutional: Negative for chills, fever and malaise/fatigue.  Eyes: Negative for blurred vision and double vision.  Respiratory: Negative for cough, shortness of breath and wheezing.   Cardiovascular: Negative for chest pain, palpitations and leg swelling.  Gastrointestinal: Negative for abdominal pain, blood in stool, constipation,  diarrhea, heartburn, nausea and vomiting.  Genitourinary: Negative for dysuria, frequency and hematuria.  Musculoskeletal: Negative for back pain and joint pain.  Skin: Positive for rash.       Breast tenderness  Neurological: Negative for dizziness, weakness and headaches.     OBJECTIVE:  Today's Vitals   12/26/20 0757  BP: 133/89  Pulse: 94  Temp: (!) 97.5 F (36.4 C)  SpO2: 98%  Weight: 150 lb (68 kg)  Height: 5\' 1"  (1.549 m)   Body mass index is 28.34 kg/m.   Physical Exam Constitutional:      General: She is not in acute distress.    Appearance: Normal appearance. She is not ill-appearing.  HENT:     Head: Normocephalic.  Eyes:     Extraocular Movements: Extraocular movements intact.     Conjunctiva/sclera: Conjunctivae normal.     Pupils: Pupils are equal, round, and reactive to light.  Cardiovascular:     Rate and Rhythm: Normal rate and regular rhythm.     Pulses: Normal pulses.     Heart sounds: Normal heart sounds. No murmur heard. No friction rub. No gallop.   Pulmonary:     Effort: Pulmonary effort is normal. No respiratory distress.     Breath sounds: Normal breath sounds. No stridor. No wheezing, rhonchi or rales.  Chest:  Breasts:     Right: Skin change and tenderness present. No swelling, bleeding, inverted nipple, mass, nipple discharge, axillary adenopathy or supraclavicular adenopathy.     Left: No swelling, bleeding, inverted nipple, mass, nipple discharge, skin change, tenderness, axillary adenopathy or supraclavicular adenopathy.     Abdominal:     General: Bowel sounds are normal.     Palpations: Abdomen is soft.     Tenderness: There is no abdominal tenderness.  Musculoskeletal:     Right lower leg: No edema.     Left lower leg: No edema.  Lymphadenopathy:     Upper Body:     Right upper body: No supraclavicular, axillary or pectoral adenopathy.     Left upper body: No supraclavicular, axillary or pectoral adenopathy.  Skin:     General: Skin is warm and dry.  Neurological:     Mental Status: She is alert and oriented to person, place, and time.  Psychiatric:        Mood and Affect: Mood normal.        Behavior: Behavior normal.     No results found for this or any previous visit (from the past 24 hour(s)).  No results found.   ASSESSMENT and PLAN  Problem List Items Addressed This Visit   None   Visit Diagnoses    Encounter for screening mammogram for malignant neoplasm of breast    -  Primary   Relevant Orders   MS DIGITAL SCREENING TOMO BILATERAL   Herpes zoster without complication  Relevant Medications   valACYclovir (VALTREX) 1000 MG tablet   Pre-conception counseling       Relevant Orders   Ambulatory referral to Endocrinology       Plan . Valacyclovir tid for 7 days: potential shingles . Encouraged to treat pain with OTC and hydrocortisone for itching . Mammogram when able . Needs physical scheduled . REI will call you to schedule . Had a long conversation about fertility pregnancy counseling and treatment plan   Return in about 3 months (around 03/26/2021) for needs phsycial and toc.    Macario Carls Teena Mangus, FNP-BC Primary Care at [facility] [address] [place], [place] [address] Ph.  [phone] Fax [phone]

## 2021-02-28 ENCOUNTER — Other Ambulatory Visit: Payer: Self-pay | Admitting: Family Medicine

## 2021-02-28 DIAGNOSIS — Z1231 Encounter for screening mammogram for malignant neoplasm of breast: Secondary | ICD-10-CM

## 2021-03-06 ENCOUNTER — Encounter: Payer: BC Managed Care – PPO | Admitting: Family Medicine

## 2021-03-27 IMAGING — US US ABDOMEN LIMITED
1 series · 14 of 25 positions shown · non-contrast
Comparison: Right upper quadrant ultrasound dated July 15, 2019.

CLINICAL DATA: Right upper quadrant pain.

EXAM:
ULTRASOUND ABDOMEN LIMITED RIGHT UPPER QUADRANT

[Series 1: us abdomen limited ruq · 14 of 46 slices shown]
[im 1/46]
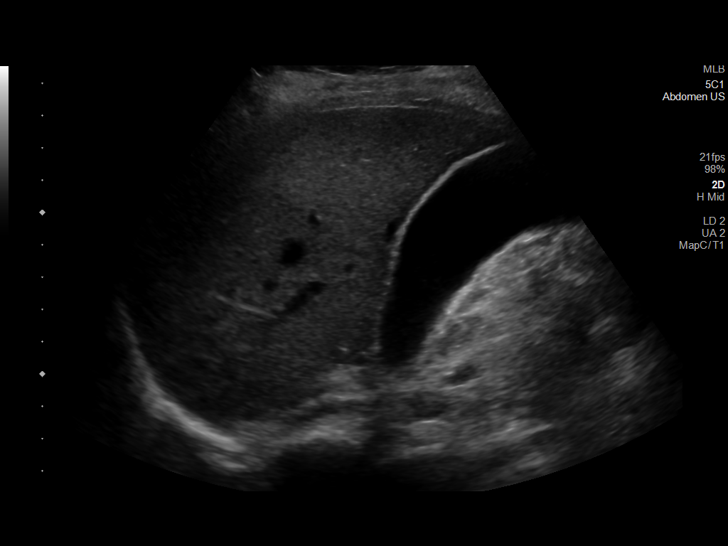
[im 4/46]
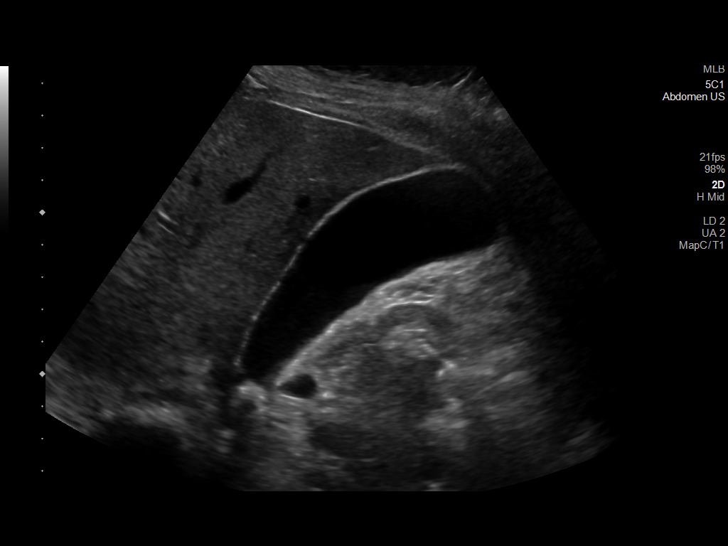
[im 8/46]
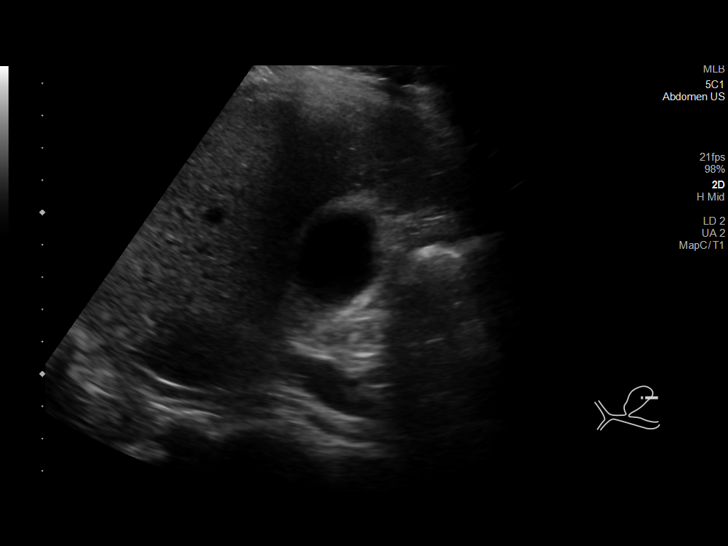
[im 12/46]
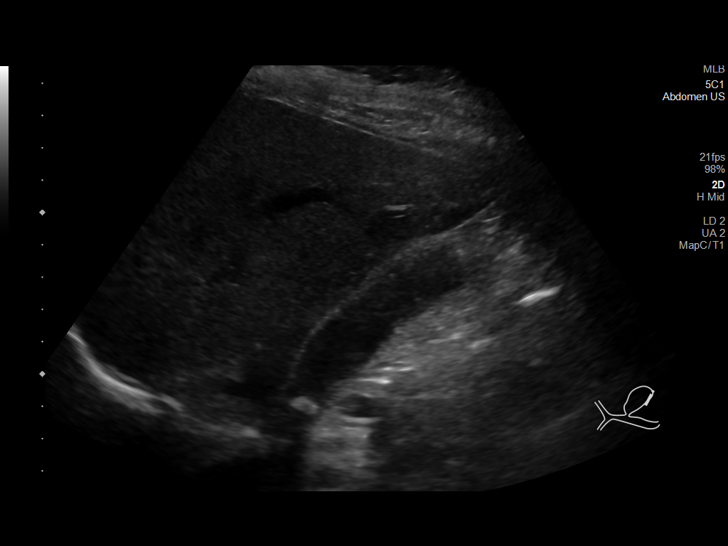
[im 16/46]
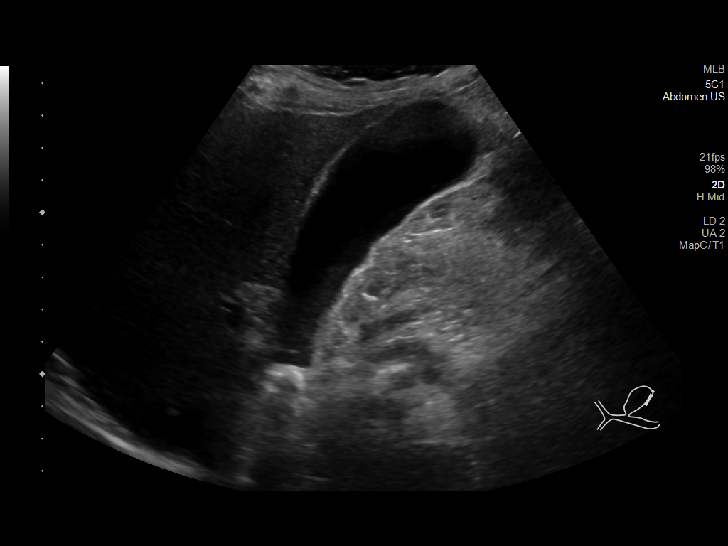
[im 17/46]
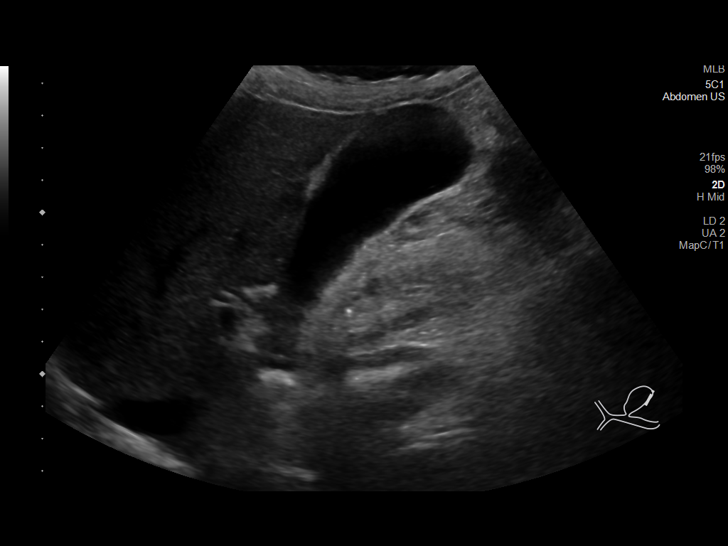
[im 21/46]
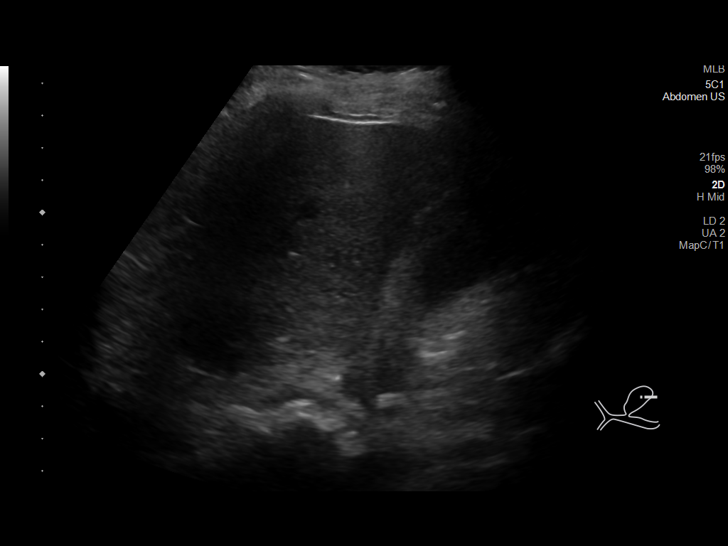
[im 25/46]
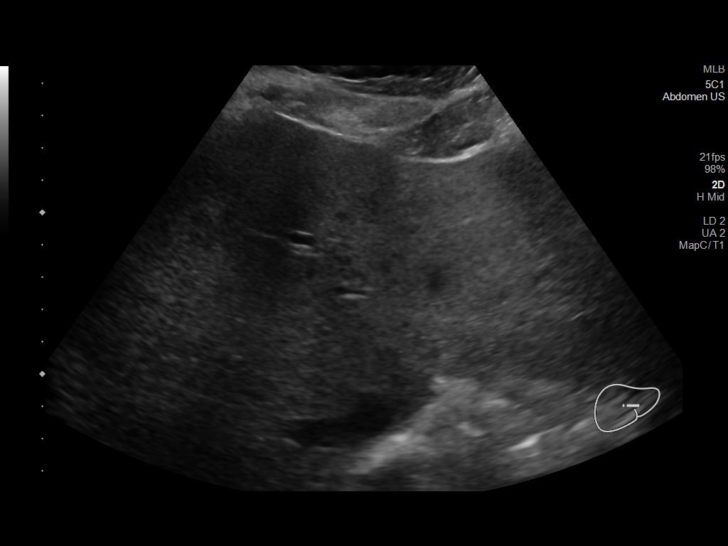
[im 29/46]
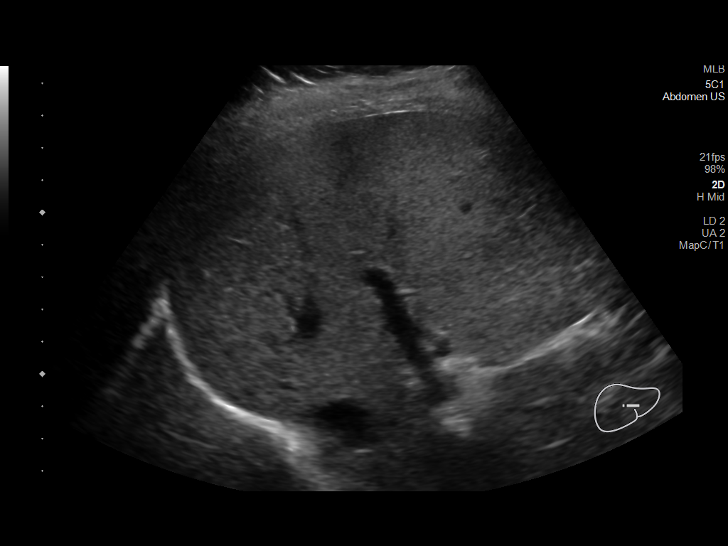
[im 31/46]
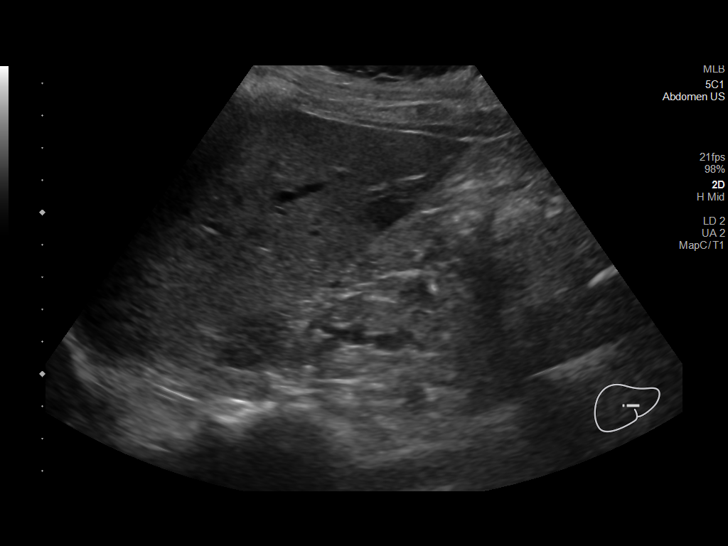
[im 34/46]
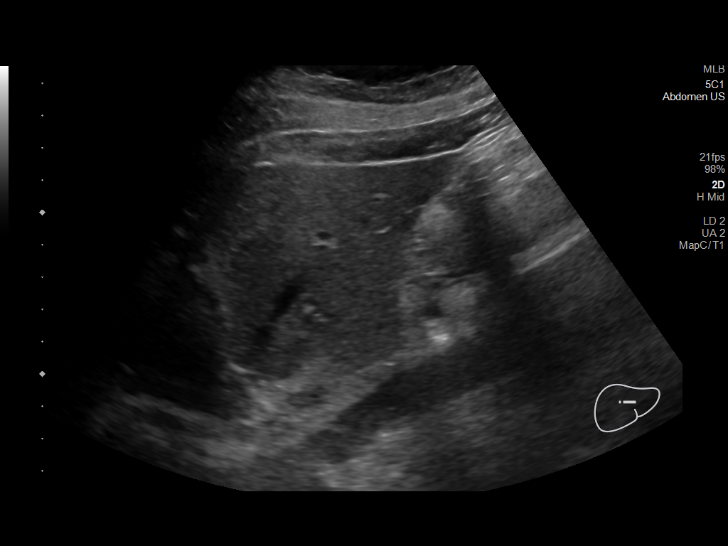
[im 38/46]
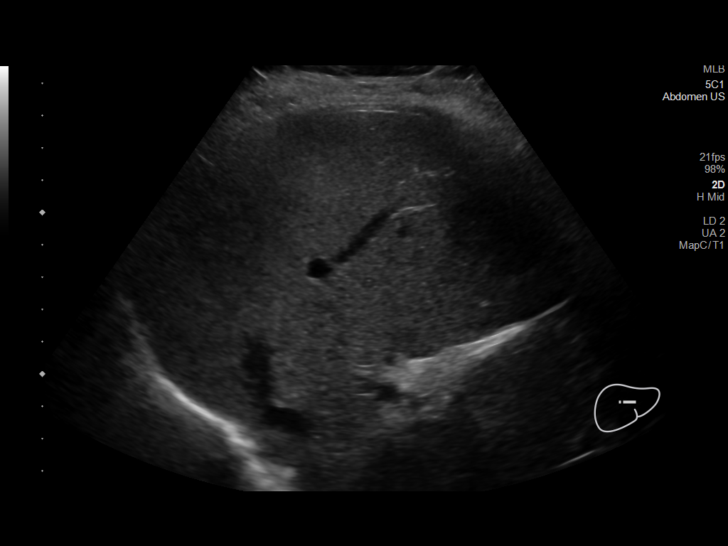
[im 42/46]
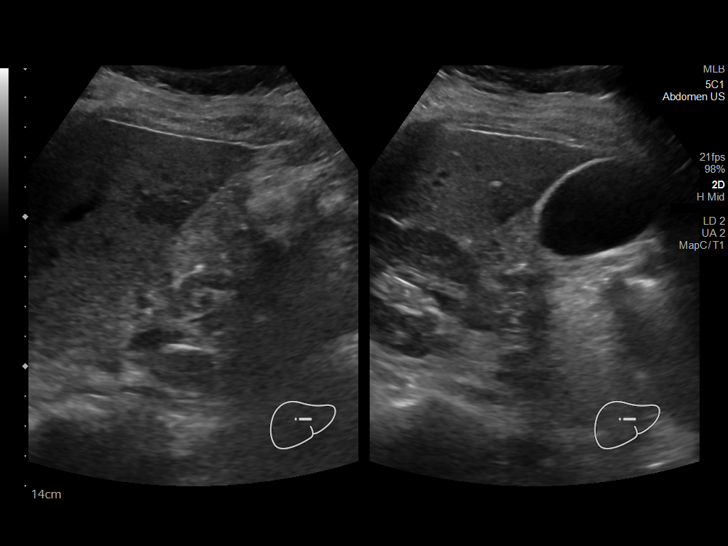
[im 46/46]
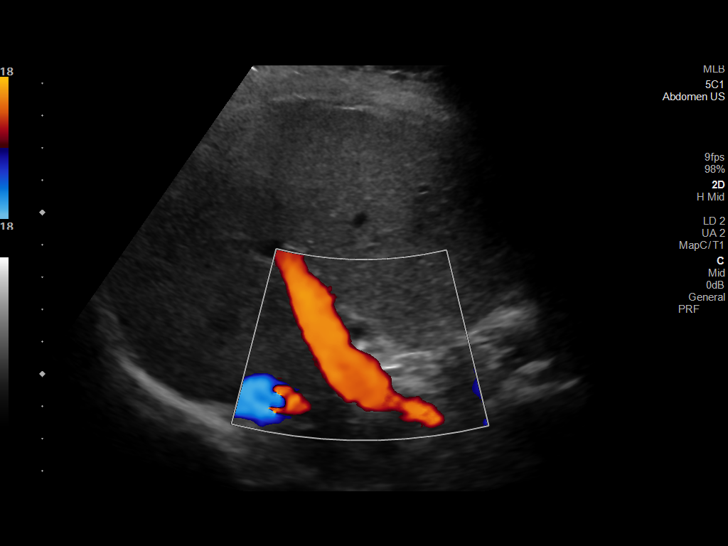

[14 of 25 positions shown; findings below may reference images not displayed]

FINDINGS: Gallbladder:

9 mm calculus again identified in the gallbladder neck. Sludge is
present. No wall thickening visualized. No sonographic Murphy sign
noted by sonographer.

Common bile duct:

Diameter: 6 mm, normal.

Liver:

No focal lesion identified. Unchanged increased parenchymal
echogenicity. Fatty sparing along the gallbladder fossa. Portal vein
is patent on color Doppler imaging with normal direction of blood
flow towards the liver.

Other: None.
IMPRESSION: 1. Similar appearing cholelithiasis without sonographic evidence of
acute cholecystitis.
2. Unchanged hepatic steatosis.

## 2021-04-23 ENCOUNTER — Ambulatory Visit
Admission: RE | Admit: 2021-04-23 | Discharge: 2021-04-23 | Disposition: A | Discharge status: Home / Self Care | Payer: BC Managed Care – PPO | Source: Ambulatory Visit | Attending: Family Medicine | Admitting: Family Medicine

## 2021-04-23 ENCOUNTER — Other Ambulatory Visit: Payer: Self-pay

## 2021-04-23 DIAGNOSIS — Z1231 Encounter for screening mammogram for malignant neoplasm of breast: Secondary | ICD-10-CM

## 2021-05-29 ENCOUNTER — Ambulatory Visit
Admission: RE | Admit: 2021-05-29 | Discharge: 2021-05-29 | Disposition: A | Discharge status: Home / Self Care | Payer: BC Managed Care – PPO | Source: Ambulatory Visit | Attending: Physician Assistant | Admitting: Physician Assistant

## 2021-05-29 ENCOUNTER — Other Ambulatory Visit: Payer: Self-pay

## 2021-05-29 VITALS — BP 128/76 | HR 88 | Temp 97.6°F | Resp 17

## 2021-05-29 DIAGNOSIS — I10 Essential (primary) hypertension: Secondary | ICD-10-CM | POA: Diagnosis not present

## 2021-05-29 MED ORDER — LISINOPRIL 10 MG PO TABS: 10.0000 mg | ORAL_TABLET | Freq: Every day | ORAL | 1 refills | Status: AC

## 2021-05-29 NOTE — ED Triage Notes (Signed)
Needs 1 month supply of lisinopril 10 mg daily.  Has appt on July 22 with pcp.

## 2021-05-29 NOTE — ED Provider Notes (Signed)
[facility]    CSN: [id number] Arrival date & time: 05/29/21  1050      History   Chief Complaint No chief complaint on file.   HPI Pamela Stanley is a 45 y.o. female.   Pt reports she is out of lisinopril 10 mg.  Pt request 30 day supply.  Pt has a doctors appointment in July.  No problems. Medication works well    Past Medical History:  Diagnosis Date   Gallstones    GERD (gastroesophageal reflux disease)    Ulcer     Patient Active Problem List   Diagnosis Date Noted   Symptomatic cholelithiasis 03/08/2020   Palpitations 01/17/2020   History of COVID-19 01/17/2020   Language barrier 01/03/2013    Past Surgical History:  Procedure Laterality Date   CHOLECYSTECTOMY N/A 03/09/2020   Procedure: LAPAROSCOPIC CHOLECYSTECTOMY;  Surgeon: Berna Bue, MD;  Location: [facility] OR;  Service: General;  Laterality: N/A;    OB History   No obstetric history on file.      Home Medications    Prior to Admission medications   Medication Sig Start Date End Date Taking? Authorizing Provider  lisinopril (ZESTRIL) 10 MG tablet Take 1 tablet (10 mg total) by mouth daily. 05/29/21 05/29/22 Yes Elson Areas, PA-C  hydrOXYzine (ATARAX/VISTARIL) 25 MG tablet Take 1 tablet (25 mg total) by mouth at bedtime as needed. 12/09/19   Georgina Quint, MD  dicyclomine (BENTYL) 20 MG tablet Take 1 tablet (20 mg total) by mouth every 6 (six) hours as needed for spasms. 05/12/19 08/13/19  Georgina Quint, MD  pantoprazole (PROTONIX) 40 MG tablet Take 1 tablet by mouth daily. 06/17/19 08/13/19  [provider]    Family History Family History  Problem Relation Age of Onset   Hyperlipidemia Mother    Hypertension Mother    Hypertension Father    Hyperlipidemia Father     Social History Social History   Tobacco Use   Smoking status: Never   Smokeless tobacco: Never  Vaping Use   Vaping Use: Never used  Substance Use Topics   Alcohol use: No     Alcohol/week: 0.0 standard drinks   Drug use: No     Allergies   Patient has no known allergies.   Review of Systems Review of Systems  All other systems reviewed and are negative.   Physical Exam Triage Vital Signs ED Triage Vitals  Enc Vitals Group     BP 05/29/21 1107 128/76     Pulse Rate 05/29/21 1107 88     Resp 05/29/21 1107 17     Temp 05/29/21 1107 97.6 F (36.4 C)     Temp Source 05/29/21 1107 Tympanic     SpO2 05/29/21 1107 98 %     Weight --      Height --      Head Circumference --      Peak Flow --      Pain Score 05/29/21 1106 0     Pain Loc --      Pain Edu? --      Excl. in GC? --    No data found.  Updated Vital Signs BP 128/76 (BP Location: Right Arm)   Pulse 88   Temp 97.6 F (36.4 C) (Tympanic)   Resp 17   SpO2 98%   Visual Acuity Right Eye Distance:   Left Eye Distance:   Bilateral Distance:    Right Eye Near:   Left Eye  Near:    Bilateral Near:     Physical Exam Vitals and nursing note reviewed.  Constitutional:      Appearance: She is well-developed.  HENT:     Head: Normocephalic.  Cardiovascular:     Rate and Rhythm: Normal rate.  Pulmonary:     Effort: Pulmonary effort is normal.  Abdominal:     General: There is no distension.  Musculoskeletal:        General: Normal range of motion.     Cervical back: Normal range of motion.  Skin:    General: Skin is warm.  Neurological:     Mental Status: She is alert and oriented to person, place, and time.     UC Treatments / Results  Labs (all labs ordered are listed, but only abnormal results are displayed) Labs Reviewed - No data to display  EKG   Radiology No results found.  Procedures Procedures (including critical care time)  Medications Ordered in UC Medications - No data to display  Initial Impression / Assessment and Plan / UC Course  I have reviewed the triage vital signs and the nursing notes.  Pertinent labs & imaging results that were available  during my care of the patient were reviewed by me and considered in my medical decision making (see chart for details).     MDM:  Pt given rx for lisinopril Final Clinical Impressions(s) / UC Diagnoses   Final diagnoses:  Essential hypertension   Discharge Instructions   None    ED Prescriptions     Medication Sig Dispense Auth. Provider   lisinopril (ZESTRIL) 10 MG tablet Take 1 tablet (10 mg total) by mouth daily. 30 tablet Elson Areas, [place]      PDMP not reviewed this encounter. An After Visit Summary was printed and given to the patient.    Elson Areas, [place] 05/29/21 1328

## 2021-06-22 ENCOUNTER — Other Ambulatory Visit: Payer: Self-pay | Admitting: Family Medicine

## 2021-06-22 ENCOUNTER — Other Ambulatory Visit (HOSPITAL_COMMUNITY)
Admission: RE | Admit: 2021-06-22 | Discharge: 2021-06-22 | Disposition: A | Discharge status: Home / Self Care | Payer: BC Managed Care – PPO | Source: Ambulatory Visit | Attending: Family Medicine | Admitting: Family Medicine

## 2021-06-22 DIAGNOSIS — Z124 Encounter for screening for malignant neoplasm of cervix: Secondary | ICD-10-CM | POA: Insufficient documentation

## 2021-06-25 LAB — CYTOLOGY - PAP
Comment: NEGATIVE
Diagnosis: NEGATIVE
High risk HPV: NEGATIVE

## 2022-10-08 ENCOUNTER — Other Ambulatory Visit: Payer: Self-pay | Admitting: Family Medicine

## 2022-10-08 DIAGNOSIS — Z1231 Encounter for screening mammogram for malignant neoplasm of breast: Secondary | ICD-10-CM

## 2023-03-13 ENCOUNTER — Ambulatory Visit
Admission: RE | Admit: 2023-03-13 | Discharge: 2023-03-13 | Disposition: A | Discharge status: Home / Self Care | Payer: BC Managed Care – PPO | Source: Ambulatory Visit | Attending: Family Medicine | Admitting: Family Medicine

## 2023-03-13 DIAGNOSIS — Z1231 Encounter for screening mammogram for malignant neoplasm of breast: Secondary | ICD-10-CM
